# Patient Record
Sex: Female | Born: 1965
Health system: Southern US, Community
[De-identification: ages and names within clinical notes are randomized; demographics above are authoritative.]

## PROBLEM LIST (undated history)

## (undated) DIAGNOSIS — K219 Gastro-esophageal reflux disease without esophagitis: Secondary | ICD-10-CM

## (undated) DIAGNOSIS — J45909 Unspecified asthma, uncomplicated: Secondary | ICD-10-CM

## (undated) DIAGNOSIS — Z9109 Other allergy status, other than to drugs and biological substances: Secondary | ICD-10-CM

## (undated) HISTORY — DX: Unspecified asthma, uncomplicated: J45.909

## (undated) HISTORY — PX: LAPAROSCOPY: SHX197

## (undated) HISTORY — DX: Gastro-esophageal reflux disease without esophagitis: K21.9

## (undated) HISTORY — DX: Other allergy status, other than to drugs and biological substances: Z91.09

---

## 2004-10-02 ENCOUNTER — Ambulatory Visit: Payer: Self-pay | Admitting: Internal Medicine

## 2005-11-20 ENCOUNTER — Ambulatory Visit: Payer: Self-pay | Admitting: Unknown Physician Specialty

## 2006-03-05 ENCOUNTER — Ambulatory Visit: Payer: Self-pay | Admitting: Gastroenterology

## 2007-10-05 ENCOUNTER — Emergency Department: Payer: Self-pay | Admitting: Emergency Medicine

## 2008-03-18 ENCOUNTER — Ambulatory Visit: Payer: Self-pay | Admitting: Unknown Physician Specialty

## 2008-10-12 ENCOUNTER — Ambulatory Visit: Payer: Self-pay | Admitting: Internal Medicine

## 2009-01-11 ENCOUNTER — Ambulatory Visit: Payer: Self-pay | Admitting: Family

## 2010-04-17 ENCOUNTER — Ambulatory Visit: Payer: Self-pay | Admitting: Unknown Physician Specialty

## 2011-05-09 ENCOUNTER — Ambulatory Visit: Payer: Self-pay | Admitting: Unknown Physician Specialty

## 2014-04-06 ENCOUNTER — Ambulatory Visit: Payer: Self-pay | Admitting: Internal Medicine

## 2014-04-14 ENCOUNTER — Ambulatory Visit: Payer: Self-pay | Admitting: Specialist

## 2015-06-14 IMAGING — CR DG CHEST 2V
1 series · 2 of 2 positions shown · non-contrast
Comparison: None.

CLINICAL DATA: Right-sided pleuritic chest pain.

EXAM:
CHEST  2 VIEW

[Series 1: w chest pa · 0.14mm/px · 2 of 2 slices shown]
[im 1/2]
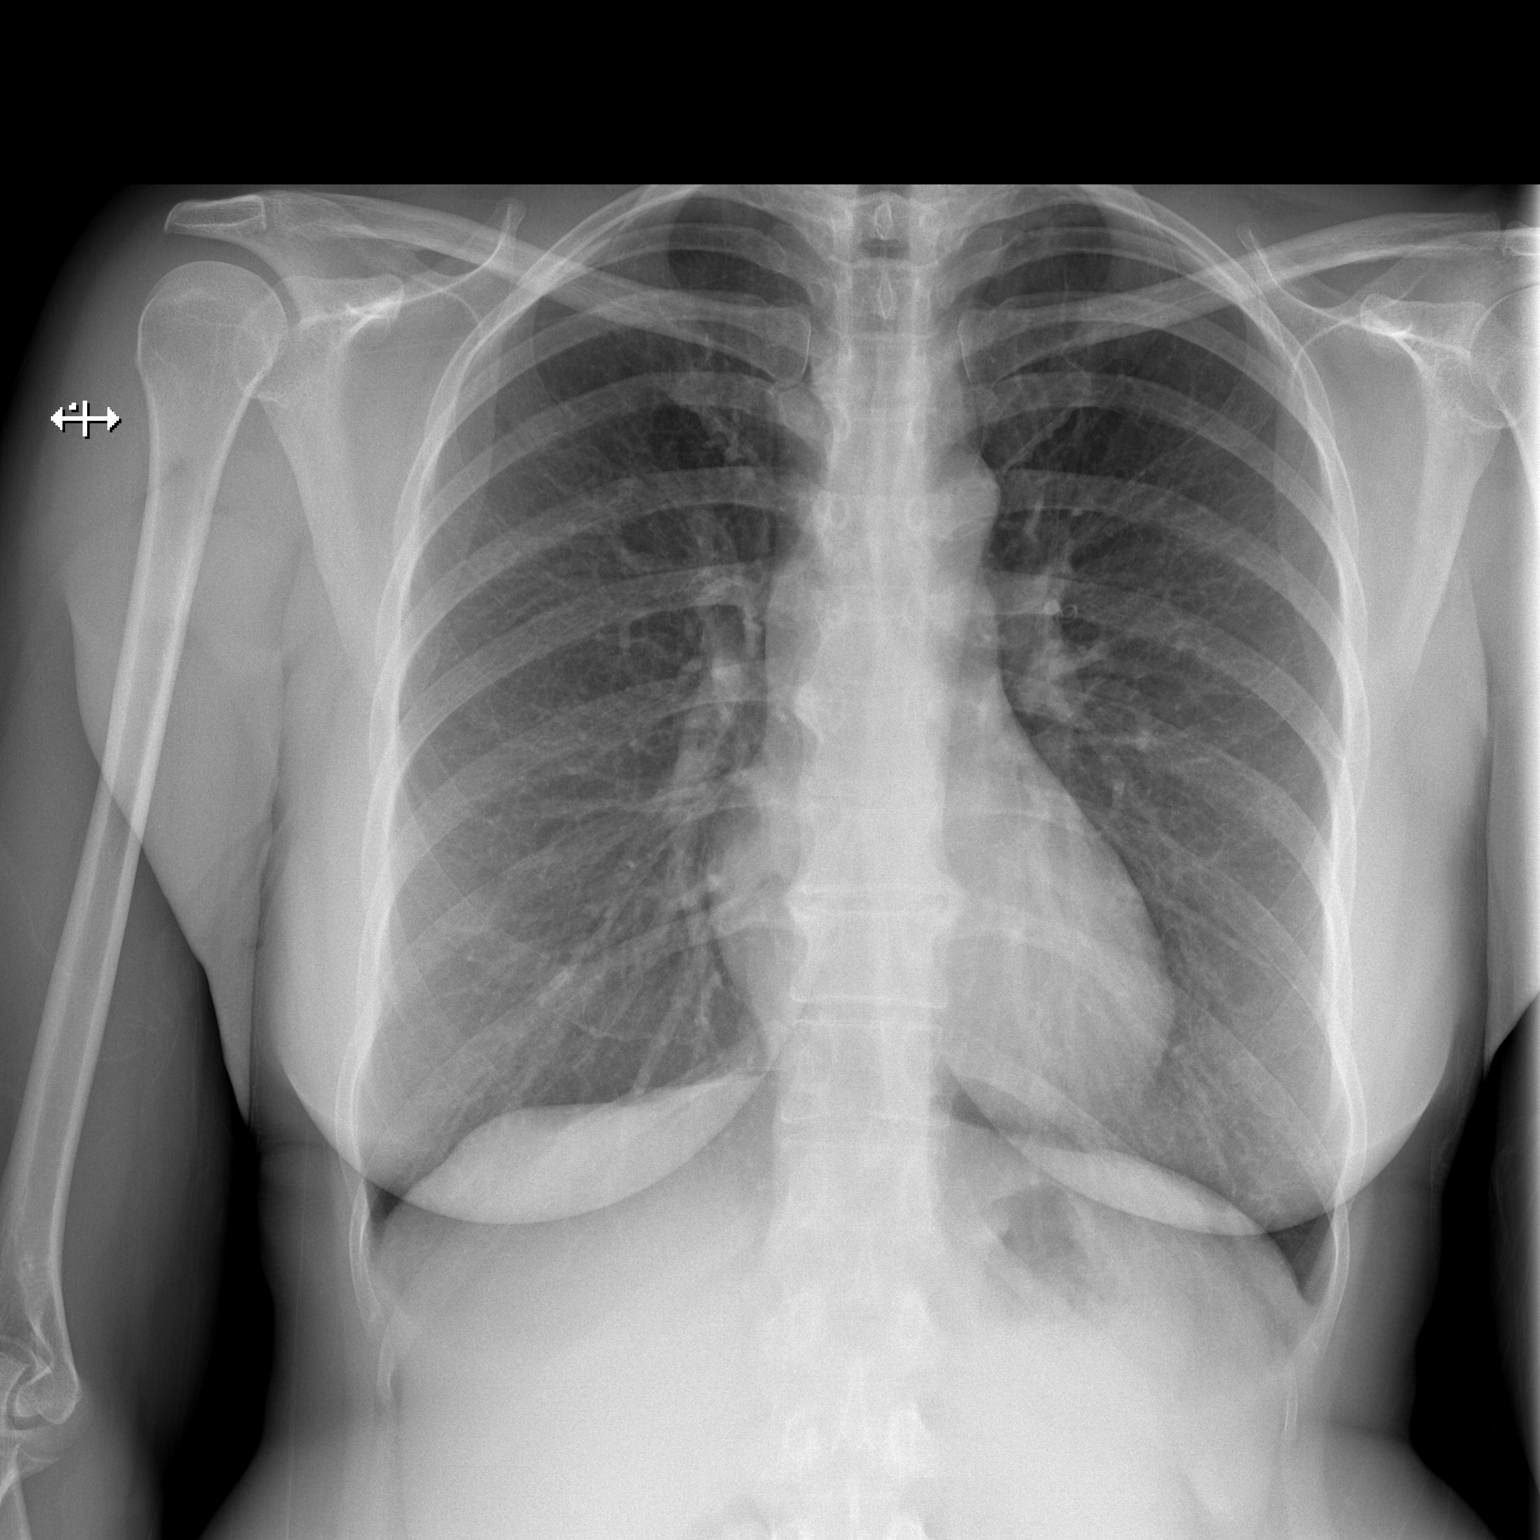
[im 2/2]
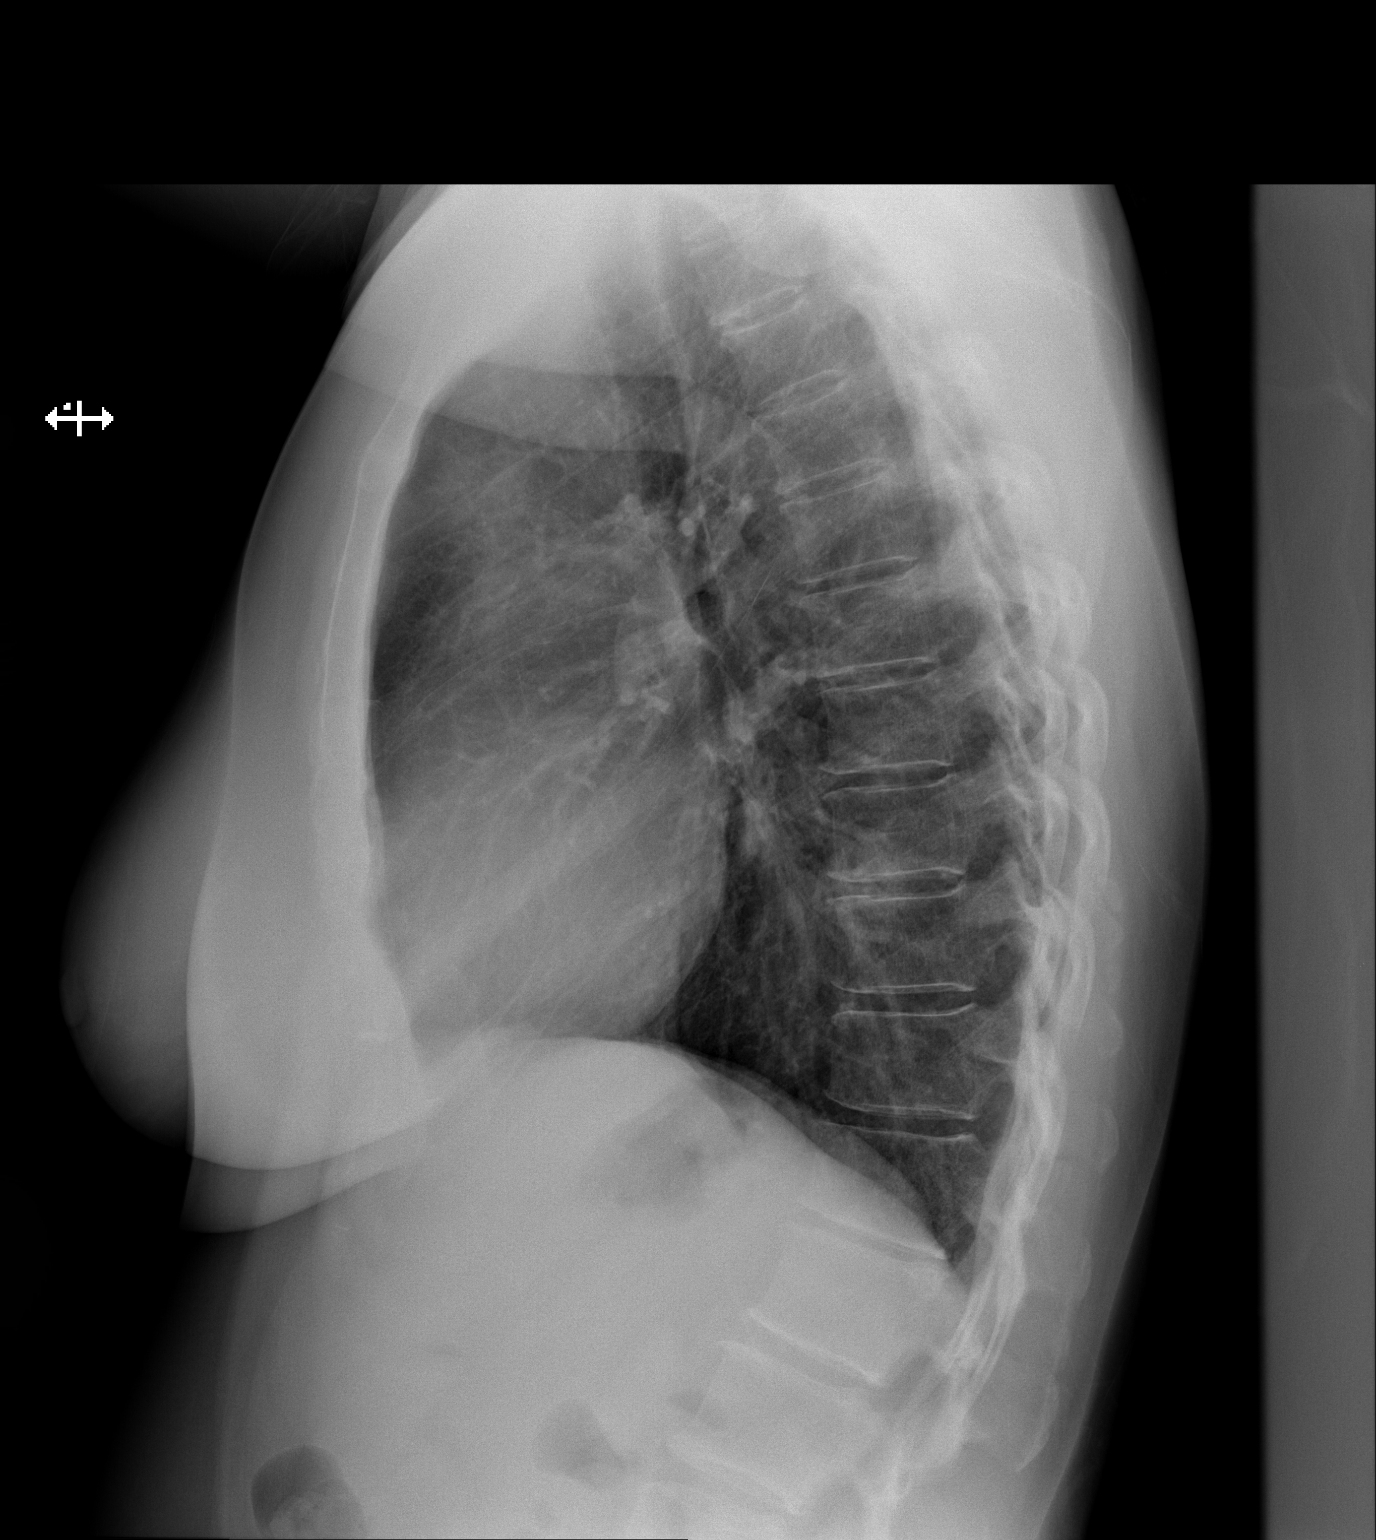

[2 of 2 positions shown; findings below may reference images not displayed]

FINDINGS: The heart size and mediastinal contours are within normal limits.
Both lungs are clear. No evidence of pneumothorax or pleural
effusion. The visualized skeletal structures are unremarkable.
IMPRESSION: No active cardiopulmonary disease.

## 2015-08-25 ENCOUNTER — Other Ambulatory Visit: Payer: Self-pay | Admitting: Internal Medicine

## 2015-08-25 DIAGNOSIS — K219 Gastro-esophageal reflux disease without esophagitis: Secondary | ICD-10-CM

## 2015-09-05 ENCOUNTER — Ambulatory Visit: Payer: Self-pay | Attending: Internal Medicine

## 2015-10-11 ENCOUNTER — Ambulatory Visit: Payer: Self-pay

## 2015-10-18 ENCOUNTER — Ambulatory Visit: Payer: Self-pay

## 2015-11-02 ENCOUNTER — Ambulatory Visit: Admission: RE | Admit: 2015-11-02 | Payer: Self-pay | Source: Ambulatory Visit

## 2016-05-17 ENCOUNTER — Ambulatory Visit
Admission: RE | Admit: 2016-05-17 | Discharge: 2016-05-17 | Disposition: A | Discharge status: Home / Self Care | Payer: BLUE CROSS/BLUE SHIELD | Source: Ambulatory Visit | Attending: Internal Medicine | Admitting: Internal Medicine

## 2016-05-17 ENCOUNTER — Other Ambulatory Visit: Payer: Self-pay | Admitting: Internal Medicine

## 2016-05-17 DIAGNOSIS — R059 Cough, unspecified: Secondary | ICD-10-CM

## 2016-05-17 DIAGNOSIS — R05 Cough: Secondary | ICD-10-CM | POA: Insufficient documentation

## 2016-08-21 ENCOUNTER — Telehealth: Payer: Self-pay | Admitting: Gastroenterology

## 2016-08-21 NOTE — Telephone Encounter (Signed)
colonoscopy

## 2016-08-22 ENCOUNTER — Other Ambulatory Visit: Payer: Self-pay

## 2016-08-22 NOTE — Telephone Encounter (Signed)
Gastroenterology Pre-Procedure Review  Request Date: 09/17/2016 Requesting Physician: Dr. Juel BurrowMasoud  PATIENT REVIEW QUESTIONS: The patient responded to the following health history questions as indicated:    1. Are you having any GI issues? yes (Diarrhea) 2. Do you have a personal history of Polyps? no 3. Do you have a family history of Colon Cancer or Polyps? no 4. Diabetes Mellitus? no 5. Joint replacements in the past 12 months?no 6. Major health problems in the past 3 months?no 7. Any artificial heart valves, MVP, or defibrillator?no    MEDICATIONS & ALLERGIES:    Patient reports the following regarding taking any anticoagulation/antiplatelet therapy:   Plavix, Coumadin, Eliquis, Xarelto, Lovenox, Pradaxa, Brilinta, or Effient? no Aspirin? no  Patient confirms/reports the following medications:  Current Outpatient Prescriptions  Medication Sig Dispense Refill  . ALBUTEROL SULFATE HFA IN Inhale into the lungs.    . budesonide-formoterol (SYMBICORT) 160-4.5 MCG/ACT inhaler Inhale 2 puffs into the lungs 2 (two) times daily.    Marland Kitchen. esomeprazole (NEXIUM) 20 MG capsule Take 20 mg by mouth daily at 12 noon.    . fluticasone (FLONASE) 50 MCG/ACT nasal spray instill 1 spray into each nostril twice a day  0  . levofloxacin (LEVAQUIN) 500 MG tablet Take 500 mg by mouth daily.    Marland Kitchen. omeprazole (PRILOSEC) 20 MG capsule Take 20 mg by mouth daily.     No current facility-administered medications for this visit.     Patient confirms/reports the following allergies:  No Known Allergies  No orders of the defined types were placed in this encounter.   AUTHORIZATION INFORMATION Primary Insurance: 1D#: Group #:  Secondary Insurance: 1D#: Group #:  SCHEDULE INFORMATION: Date: 09/17/2016 Time: Location: [facility]

## 2016-08-22 NOTE — Telephone Encounter (Signed)
Screening Colonoscopy Z12.11 [facility] 09/17/2016 Please pre cert  

## 2016-08-29 ENCOUNTER — Ambulatory Visit
Admission: RE | Admit: 2016-08-29 | Discharge: 2016-08-29 | Disposition: A | Discharge status: Home / Self Care | Payer: Self-pay | Source: Ambulatory Visit | Attending: Family Medicine | Admitting: Family Medicine

## 2016-08-29 ENCOUNTER — Other Ambulatory Visit (HOSPITAL_COMMUNITY): Payer: Self-pay | Admitting: Family Medicine

## 2016-08-29 DIAGNOSIS — R05 Cough: Secondary | ICD-10-CM

## 2016-08-29 DIAGNOSIS — R059 Cough, unspecified: Secondary | ICD-10-CM

## 2016-08-29 DIAGNOSIS — I700 Atherosclerosis of aorta: Secondary | ICD-10-CM | POA: Insufficient documentation

## 2016-09-12 ENCOUNTER — Other Ambulatory Visit: Payer: Self-pay

## 2016-09-17 ENCOUNTER — Other Ambulatory Visit: Payer: Self-pay

## 2016-09-17 DIAGNOSIS — Z1211 Encounter for screening for malignant neoplasm of colon: Secondary | ICD-10-CM

## 2016-09-17 MED ORDER — PEG 3350-KCL-NABCB-NACL-NASULF 236 G PO SOLR: 4000.0000 mL | Freq: Once | ORAL | 0 refills | Status: AC

## 2016-09-17 MED ORDER — NA SULFATE-K SULFATE-MG SULF 17.5-3.13-1.6 GM/177ML PO SOLN: 1.0000 | Freq: Once | ORAL | 0 refills | Status: AC

## 2016-10-02 ENCOUNTER — Telehealth: Payer: Self-pay

## 2016-10-02 NOTE — Telephone Encounter (Signed)
Patient initially called to reschedule her colonoscopy. I gave her November 2 and she was fine with that date. I let her know again that the procedure will be done by doctor Wohl on that day. She thought she was having the procedure performed by a female Bangladeshindian doctor. I let her know that we do not have a GI lady physician in our clinic. She wants her procedure to be canceled. I told her that I will cancel her procedure and advised her to contact her PCP to have a referral sent to the right provider.

## 2016-10-08 ENCOUNTER — Encounter: Admission: RE | Payer: Self-pay | Source: Ambulatory Visit

## 2016-10-08 ENCOUNTER — Ambulatory Visit
Admission: RE | Admit: 2016-10-08 | Payer: BLUE CROSS/BLUE SHIELD | Source: Ambulatory Visit | Admitting: Gastroenterology

## 2016-10-08 SURGERY — COLONOSCOPY WITH PROPOFOL
Anesthesia: General

## 2017-11-06 IMAGING — CR DG CHEST 1V
1 series · 1 of 1 positions shown · non-contrast
Comparison: PA and lateral chest x-ray May 17, 2016

CLINICAL DATA: Cough, history of asthma, recurrent bronchitis.
Nonsmoker.

EXAM:
CHEST 1 VIEW

[w chest pa]
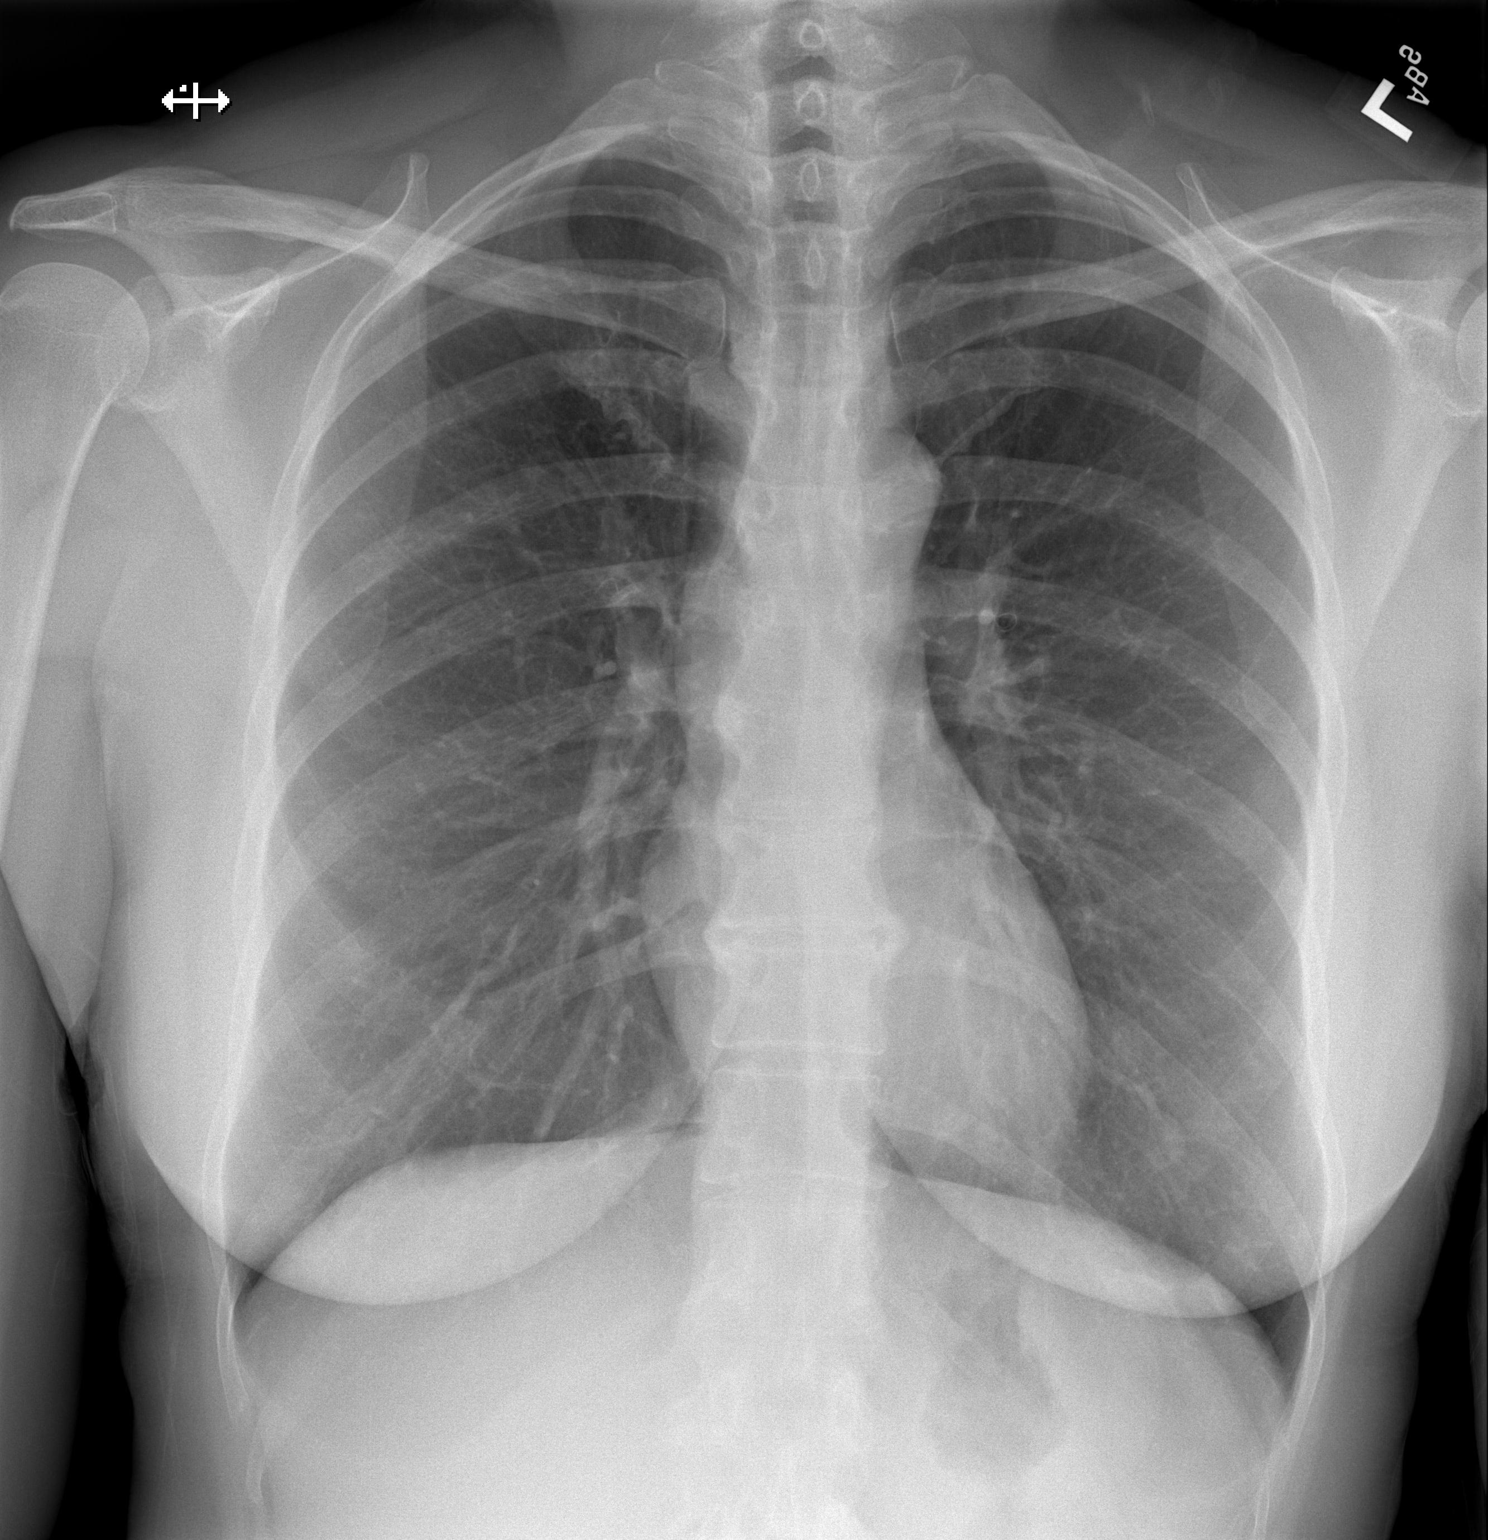

[1 of 1 positions shown; findings below may reference images not displayed]

FINDINGS: The lungs are mildly hyperinflated but clear. The heart and
pulmonary vascularity are normal. The mediastinum is normal in
width. There is calcification in the wall of the aortic arch. There
is mild degenerative disc disease of the thoracic spine.
IMPRESSION: Hyperinflation consistent with reactive airway disease. There is no
pneumonia nor other acute cardiopulmonary abnormality.

Aortic atherosclerosis.

## 2018-04-17 ENCOUNTER — Ambulatory Visit: Payer: BLUE CROSS/BLUE SHIELD | Admitting: Internal Medicine

## 2018-04-17 ENCOUNTER — Ambulatory Visit: Payer: Self-pay | Admitting: Internal Medicine

## 2018-04-17 ENCOUNTER — Encounter: Payer: Self-pay | Admitting: Internal Medicine

## 2018-04-17 VITALS — BP 118/72 | HR 85 | Resp 16 | Ht 65.0 in | Wt 166.4 lb

## 2018-04-17 DIAGNOSIS — J4520 Mild intermittent asthma, uncomplicated: Secondary | ICD-10-CM

## 2018-04-17 DIAGNOSIS — R0602 Shortness of breath: Secondary | ICD-10-CM | POA: Diagnosis not present

## 2018-04-17 DIAGNOSIS — K219 Gastro-esophageal reflux disease without esophagitis: Secondary | ICD-10-CM

## 2018-04-17 DIAGNOSIS — Z9109 Other allergy status, other than to drugs and biological substances: Secondary | ICD-10-CM | POA: Insufficient documentation

## 2018-04-17 DIAGNOSIS — J301 Allergic rhinitis due to pollen: Secondary | ICD-10-CM | POA: Diagnosis not present

## 2018-04-17 DIAGNOSIS — J45909 Unspecified asthma, uncomplicated: Secondary | ICD-10-CM | POA: Insufficient documentation

## 2018-04-17 NOTE — Patient Instructions (Signed)

## 2018-04-17 NOTE — Progress Notes (Signed)
[facility] [address] [place], [place] [address]  Pulmonary Sleep Medicine   Office Visit Note  Patient Name: Yvonne Jones DOB: February 06, 1966 MRN [id number]  Date of Service: 04/17/2018  Complaints/HPI:  Patient is doing fairly well at this time.  She has little bit of cough she was [place] and feels that she contracted something overseas but now is actually resolution.  She has not been taking allergy shots since last October.  At this time she wants to wait for another month before she tries to restart her allergy shots.  She is doing well eyes spirometry actually looks good  ROS  General: (-) fever, (-) chills, (-) night sweats, (-) weakness Skin: (-) rashes, (-) itching,. Eyes: (-) visual changes, (-) redness, (-) itching. Nose and Sinuses: (-) nasal stuffiness or itchiness, (-) postnasal drip, (-) nosebleeds, (-) sinus trouble. Mouth and Throat: (-) sore throat, (-) hoarseness. Neck: (-) swollen glands, (-) enlarged thyroid, (-) neck pain. Respiratory: + cough, (-) bloody sputum, + shortness of breath, + wheezing. Cardiovascular: - ankle swelling, (-) chest pain. Lymphatic: (-) lymph node enlargement. Neurologic: (-) numbness, (-) tingling. Psychiatric: (-) anxiety, (-) depression   Current Medication: Outpatient Encounter Medications as of 04/17/2018  Medication Sig Note  . ALBUTEROL SULFATE HFA IN Inhale into the lungs.   . budesonide-formoterol (SYMBICORT) 160-4.5 MCG/ACT inhaler Inhale 2 puffs into the lungs 2 (two) times daily.   . montelukast (SINGULAIR) 10 MG tablet Take 10 mg by mouth at bedtime.   . fluconazole (DIFLUCAN) 150 MG tablet  09/17/2016: Received from: External Pharmacy  . fluticasone (FLONASE) 50 MCG/ACT nasal spray instill 1 spray into each nostril twice a day 08/22/2016: Received from: External Pharmacy  . [DISCONTINUED] esomeprazole (NEXIUM) 20 MG capsule Take 20 mg by mouth daily at 12 noon.   . [DISCONTINUED] levofloxacin (LEVAQUIN)  500 MG tablet Take 500 mg by mouth daily.   . [DISCONTINUED] omeprazole (PRILOSEC) 20 MG capsule Take 20 mg by mouth daily.    No facility-administered encounter medications on file as of 04/17/2018.     Surgical History:   Medical History: Past Medical History:  Diagnosis Date  . Asthma   . Environmental allergies   . GERD (gastroesophageal reflux disease)     Family History: Family History  Problem Relation Age of Onset  . Heart disease Mother   . Diabetes Father     Social History: Social History   Socioeconomic History  . Marital status: Married    Spouse name: Not on file  . Number of children: Not on file  . Years of education: Not on file  . Highest education level: Not on file  Occupational History  . Not on file  Social Needs  . Financial resource strain: Not on file  . Food insecurity:    Worry: Not on file    Inability: Not on file  . Transportation needs:    Medical: Not on file    Non-medical: Not on file  Tobacco Use  . Smoking status: Never Smoker  . Smokeless tobacco: Never Used  Substance and Sexual Activity  . Alcohol use: Never    Frequency: Never  . Drug use: Never  . Sexual activity: Not on file  Lifestyle  . Physical activity:    Days per week: Not on file    Minutes per session: Not on file  . Stress: Not on file  Relationships  . Social connections:    Talks on phone: Not on file  Gets together: Not on file    Attends religious service: Not on file    Active member of club or organization: Not on file    Attends meetings of clubs or organizations: Not on file    Relationship status: Not on file  . Intimate partner violence:    Fear of current or ex partner: Not on file    Emotionally abused: Not on file    Physically abused: Not on file    Forced sexual activity: Not on file  Other Topics Concern  . Not on file  Social History Narrative  . Not on file    Vital Signs: Blood pressure 118/72, pulse 85, resp. rate 16,  height 5\' 5"  (1.651 m), weight 166 lb 6.4 oz (75.5 kg), SpO2 98 %.  Examination: General Appearance: The patient is well-developed, well-nourished, and in no distress. Skin: Gross inspection of skin unremarkable. Head: normocephalic, no gross deformities. Eyes: no gross deformities noted. ENT: ears appear grossly normal no exudates. Neck: Supple. No thyromegaly. No LAD. Respiratory:  clear. Cardiovascular: Normal S1 and S2 without murmur or rub. Extremities: No cyanosis. pulses are equal. Neurologic: Alert and oriented. No involuntary movements.  LABS: No results found for this or any previous visit (from the past 2160 hour(s)).  Radiology: Dg Chest 1 View  Result Date: 08/29/2016 CLINICAL DATA:  Cough, history of asthma, recurrent bronchitis. Nonsmoker. EXAM: CHEST 1 VIEW COMPARISON:  PA and lateral chest x-ray of May 17, 2016 FINDINGS: The lungs are mildly hyperinflated but clear. The heart and pulmonary vascularity are normal. The mediastinum is normal in width. There is calcification in the wall of the aortic arch. There is mild degenerative disc disease of the thoracic spine. IMPRESSION: Hyperinflation consistent with reactive airway disease. There is no pneumonia nor other acute cardiopulmonary abnormality. Aortic atherosclerosis. Electronically Signed   By: David  SwazilandJordan M.D.   On: 08/29/2016 13:37    No results found.  No results found.    Assessment and Plan: Patient Active Problem List   Diagnosis Date Noted  . Environmental allergies 04/17/2018  . Asthma 04/17/2018  . GERD (gastroesophageal reflux disease) 04/17/2018    1. Asthma continue with present therapy continue supportive care 2. Allergic Rhinitis she is taking antihistamine Singulair continue 3. GERD no reflux noted at this 4.  shortness of breath will continue present  General Counseling: I have discussed the findings of the evaluation and examination with Yvonne Jones.  I have also discussed any further  diagnostic evaluation thatmay be needed or ordered today. Yvonne Jones verbalizes understanding of the findings of todays visit. We also reviewed her medications today and discussed drug interactions and side effects including but not limited excessive drowsiness and altered mental states. We also discussed that there is always a risk not just to her but also people around her. she has been encouraged to call the office with any questions or concerns that should arise related to todays visit.    Time spent: 15min  I have personally obtained a history, examined the patient, evaluated laboratory and imaging results, formulated the assessment and plan and placed orders.    Yevonne PaxSaadat A Khan, MD [facility] Pulmonary and Critical Care Sleep medicine

## 2018-04-28 ENCOUNTER — Ambulatory Visit: Payer: Self-pay | Admitting: Internal Medicine

## 2018-07-17 ENCOUNTER — Other Ambulatory Visit: Payer: Self-pay | Admitting: Internal Medicine

## 2018-07-17 DIAGNOSIS — Z1231 Encounter for screening mammogram for malignant neoplasm of breast: Secondary | ICD-10-CM

## 2018-07-25 ENCOUNTER — Encounter: Payer: Self-pay | Admitting: Certified Nurse Midwife

## 2018-07-25 ENCOUNTER — Ambulatory Visit: Payer: BLUE CROSS/BLUE SHIELD | Admitting: Certified Nurse Midwife

## 2018-07-25 ENCOUNTER — Other Ambulatory Visit (HOSPITAL_COMMUNITY)
Admission: RE | Admit: 2018-07-25 | Discharge: 2018-07-25 | Disposition: A | Discharge status: Home / Self Care | Payer: BLUE CROSS/BLUE SHIELD | Source: Ambulatory Visit | Attending: Certified Nurse Midwife | Admitting: Certified Nurse Midwife

## 2018-07-25 VITALS — BP 118/70 | HR 75 | Ht 65.0 in | Wt 162.1 lb

## 2018-07-25 DIAGNOSIS — N898 Other specified noninflammatory disorders of vagina: Secondary | ICD-10-CM

## 2018-07-25 DIAGNOSIS — Z124 Encounter for screening for malignant neoplasm of cervix: Secondary | ICD-10-CM | POA: Diagnosis not present

## 2018-07-25 MED ORDER — CLOBETASOL PROPIONATE 0.05 % EX OINT
1.0000 | TOPICAL_OINTMENT | Freq: Two times a day (BID) | CUTANEOUS | 0 refills | Status: DC
Start: 2018-07-25 — End: 2020-09-29

## 2018-07-25 NOTE — Progress Notes (Signed)
GYNECOLOGY ANNUAL PREVENTATIVE CARE ENCOUNTER NOTE  Subjective:   Yvonne Jones is a 52 y.o. [id number] female here for a routine annual gynecologic exam.  Current complaints: vaginal itching.   Has Lichen sclerous.  Denies abnormal vaginal bleeding, discharge, pelvic pain, problems with intercourse or other gynecologic concerns.    Gynecologic History No LMP recorded. Patient is postmenopausal. Contraception: none and menoupause Last Pap: 6-7 yrs ago. Results were: normal per pt Last mammogram: 05/09/2011. Results were: normal ( has family history of breast cancer)  Obstetric History OB History  Gravida Para Term Preterm AB Living  2 2 2     2   SAB TAB Ectopic Multiple Live Births          2    # Outcome Date GA Lbr Len/2nd Weight Sex Delivery Anes PTL Lv  2 Term 1986    F Vag-Spont   LIV  1 Term 1984    M Vag-Spont   LIV    Past Medical History:  Diagnosis Date  . Asthma   . Environmental allergies   . GERD (gastroesophageal reflux disease)     Past Surgical History:  Procedure Laterality Date  . LAPAROSCOPY      Current Outpatient Medications on File Prior to Visit  Medication Sig Dispense Refill  . ALBUTEROL SULFATE HFA IN Inhale into the lungs.    . budesonide-formoterol (SYMBICORT) 160-4.5 MCG/ACT inhaler Inhale 2 puffs into the lungs 2 (two) times daily.    . fluconazole (DIFLUCAN) 150 MG tablet   0  . montelukast (SINGULAIR) 10 MG tablet Take 10 mg by mouth at bedtime.    . fluticasone (FLONASE) 50 MCG/ACT nasal spray instill 1 spray into each nostril twice a day  0   No current facility-administered medications on file prior to visit.     No Known Allergies  Social History:  reports that she has never smoked. She has never used smokeless tobacco. She reports that she does not drink alcohol or use drugs.  Currently exercises 3-4 x wk for 1-1.5 hrs.  Decreased caffeine in take recently to decrease menopausal symptoms Family History  Problem Relation Age of  Onset  . Heart disease Mother   . Diabetes Father     The following portions of the patient's history were reviewed and updated as appropriate: allergies, current medications, past family history, past medical history, past social history, past surgical history and problem list.  Review of Systems Pertinent items noted in HPI and remainder of comprehensive ROS otherwise negative.   Objective:  BP 118/70   Pulse 75   Ht 5\' 5"  (1.651 m)   Wt 162 lb 2 oz (73.5 kg)   BMI 26.98 kg/m  CONSTITUTIONAL: Well-developed, well-nourished female in no acute distress.  HENT:  Normocephalic, atraumatic, External right and left ear normal. Oropharynx is clear and moist EYES: Conjunctivae and EOM are normal. Pupils are equal, round, and reactive to light. No scleral icterus.  NECK: Normal range of motion, supple, no masses.  Normal thyroid.  SKIN: Skin is warm and dry. No rash noted. Not diaphoretic. No erythema. No pallor. MUSCULOSKELETAL: Normal range of motion. No tenderness.  No cyanosis, clubbing, or edema.  2+ distal pulses. NEUROLOGIC: Alert and oriented to person, place, and time. Normal reflexes, muscle tone coordination. No cranial nerve deficit noted. PSYCHIATRIC: Normal mood and affect. Normal behavior. Normal judgment and thought content. CARDIOVASCULAR: Normal heart rate noted, regular rhythm RESPIRATORY: Clear to auscultation bilaterally. Effort and breath sounds normal, no problems  with respiration noted. BREASTS: Symmetric in size. No masses, skin changes, nipple drainage, or lymphadenopathy. ABDOMEN: Soft, normal bowel sounds, no distention noted.  No tenderness, rebound or guarding.  PELVIC: Normal appearing external genitalia; normal appearing vaginal mucosa and cervix.  No abnormal discharge noted.  Pap smear obtained.  Contract bleeding noted. Vaginal atrophy noted . Normal uterine size, no other palpable masses, no uterine or adnexal tenderness.    Assessment and Plan:  Annual  Well women exam Will follow up results of pap smear and manage accordingly. Vaginal swab BV Mammogram scheduled Pt has some symptoms of menopause. She declines hormonal treatment at this time. Information on self help measure given . Encourage use of lubrication with intercourse.  Pt state she has had labs with other provider. Declines at this time.  Information on genetic testing for Breast cancer gene given.  Routine preventative health maintenance measures emphasized. Please refer to After Visit Summary for other counseling recommendations.    Doreene Burke, CNM

## 2018-07-25 NOTE — Patient Instructions (Signed)
Preventive Care 40-64 Years, Female Preventive care refers to lifestyle choices and visits with your health care provider that can promote health and wellness. What does preventive care include?  A yearly physical exam. This is also called an annual well check.  Dental exams once or twice a year.  Routine eye exams. Ask your health care provider how often you should have your eyes checked.  Personal lifestyle choices, including: ? Daily care of your teeth and gums. ? Regular physical activity. ? Eating a healthy diet. ? Avoiding tobacco and drug use. ? Limiting alcohol use. ? Practicing safe sex. ? Taking low-dose aspirin daily starting at age 58. ? Taking vitamin and mineral supplements as recommended by your health care provider. What happens during an annual well check? The services and screenings done by your health care provider during your annual well check will depend on your age, overall health, lifestyle risk factors, and family history of disease. Counseling Your health care provider may ask you questions about your:  Alcohol use.  Tobacco use.  Drug use.  Emotional well-being.  Home and relationship well-being.  Sexual activity.  Eating habits.  Work and work Statistician.  Method of birth control.  Menstrual cycle.  Pregnancy history.  Screening You may have the following tests or measurements:  Height, weight, and BMI.  Blood pressure.  Lipid and cholesterol levels. These may be checked every 5 years, or more frequently if you are over 81 years old.  Skin check.  Lung cancer screening. You may have this screening every year starting at age 78 if you have a 30-pack-year history of smoking and currently smoke or have quit within the past 15 years.  Fecal occult blood test (FOBT) of the stool. You may have this test every year starting at age 65.  Flexible sigmoidoscopy or colonoscopy. You may have a sigmoidoscopy every 5 years or a colonoscopy  every 10 years starting at age 30.  Hepatitis C blood test.  Hepatitis B blood test.  Sexually transmitted disease (STD) testing.  Diabetes screening. This is done by checking your blood sugar (glucose) after you have not eaten for a while (fasting). You may have this done every 1-3 years.  Mammogram. This may be done every 1-2 years. Talk to your health care provider about when you should start having regular mammograms. This may depend on whether you have a family history of breast cancer.  BRCA-related cancer screening. This may be done if you have a family history of breast, ovarian, tubal, or peritoneal cancers.  Pelvic exam and Pap test. This may be done every 3 years starting at age 80. Starting at age 36, this may be done every 5 years if you have a Pap test in combination with an HPV test.  Bone density scan. This is done to screen for osteoporosis. You may have this scan if you are at high risk for osteoporosis.  Discuss your test results, treatment options, and if necessary, the need for more tests with your health care provider. Vaccines Your health care provider may recommend certain vaccines, such as:  Influenza vaccine. This is recommended every year.  Tetanus, diphtheria, and acellular pertussis (Tdap, Td) vaccine. You may need a Td booster every 10 years.  Varicella vaccine. You may need this if you have not been vaccinated.  Zoster vaccine. You may need this after age 5.  Measles, mumps, and rubella (MMR) vaccine. You may need at least one dose of MMR if you were born in  1958 or later. You may also need a second dose.  Pneumococcal 13-valent conjugate (PCV13) vaccine. You may need this if you have certain conditions and were not previously vaccinated.  Pneumococcal polysaccharide (PPSV23) vaccine. You may need one or two doses if you smoke cigarettes or if you have certain conditions.  Meningococcal vaccine. You may need this if you have certain  conditions.  Hepatitis A vaccine. You may need this if you have certain conditions or if you travel or work in places where you may be exposed to hepatitis A.  Hepatitis B vaccine. You may need this if you have certain conditions or if you travel or work in places where you may be exposed to hepatitis B.  Haemophilus influenzae type b (Hib) vaccine. You may need this if you have certain conditions.  Talk to your health care provider about which screenings and vaccines you need and how often you need them. This information is not intended to replace advice given to you by your health care provider. Make sure you discuss any questions you have with your health care provider. Document Released: 01/06/2016 Document Revised: 08/29/2016 Document Reviewed: 10/11/2015 Elsevier Interactive Patient Education  2018 Elsevier Inc.  

## 2018-07-25 NOTE — Progress Notes (Signed)
[facility]. Denies vag discharge. Also has a lump left axillary. Would like a pap smear- LPS about 7 years ago and was normal.

## 2018-07-28 LAB — CYTOLOGY - PAP
DIAGNOSIS: NEGATIVE
HPV: NOT DETECTED

## 2018-07-28 LAB — CERVICOVAGINAL ANCILLARY ONLY
BACTERIAL VAGINITIS: NEGATIVE
CANDIDA VAGINITIS: NEGATIVE

## 2018-07-29 ENCOUNTER — Telehealth: Payer: Self-pay

## 2018-07-29 ENCOUNTER — Telehealth: Payer: Self-pay | Admitting: Certified Nurse Midwife

## 2018-07-29 NOTE — Telephone Encounter (Signed)
Left message for pt to please return my call regarding negative pap and cultures per AT.

## 2018-07-29 NOTE — Telephone Encounter (Signed)
Another message left for pt to please return my call re: negative test results per ML.

## 2018-07-29 NOTE — Telephone Encounter (Signed)
The patient called and stated that she would lie,k to speak with her nurse in regards to her test results. No other information was disclosed. Please advise.

## 2018-07-30 ENCOUNTER — Telehealth: Payer: Self-pay

## 2018-07-30 NOTE — Telephone Encounter (Signed)
Returned pts call after leaving 2 messages on her voicemail- negative test results per AT.

## 2018-08-11 ENCOUNTER — Ambulatory Visit
Admission: RE | Admit: 2018-08-11 | Discharge: 2018-08-11 | Disposition: A | Discharge status: Home / Self Care | Payer: BLUE CROSS/BLUE SHIELD | Source: Ambulatory Visit | Attending: Internal Medicine | Admitting: Internal Medicine

## 2018-08-11 DIAGNOSIS — Z1231 Encounter for screening mammogram for malignant neoplasm of breast: Secondary | ICD-10-CM | POA: Insufficient documentation

## 2018-09-23 ENCOUNTER — Encounter: Payer: Self-pay | Admitting: Gastroenterology

## 2018-09-23 ENCOUNTER — Ambulatory Visit: Payer: BLUE CROSS/BLUE SHIELD | Admitting: Gastroenterology

## 2018-09-23 ENCOUNTER — Encounter (INDEPENDENT_AMBULATORY_CARE_PROVIDER_SITE_OTHER): Payer: Self-pay

## 2018-09-23 ENCOUNTER — Other Ambulatory Visit: Payer: Self-pay

## 2018-09-23 VITALS — BP 112/76 | HR 71 | Resp 17 | Ht 65.0 in | Wt 160.8 lb

## 2018-09-23 DIAGNOSIS — K529 Noninfective gastroenteritis and colitis, unspecified: Secondary | ICD-10-CM | POA: Diagnosis not present

## 2018-09-23 DIAGNOSIS — A048 Other specified bacterial intestinal infections: Secondary | ICD-10-CM | POA: Diagnosis not present

## 2018-09-23 DIAGNOSIS — Z1211 Encounter for screening for malignant neoplasm of colon: Secondary | ICD-10-CM

## 2018-09-23 NOTE — Progress Notes (Signed)
Arlyss Repress, MD [address]  [address]  [place], [place] [address]  Main: [phone]  Fax: [phone]    Gastroenterology Consultation  Referring Provider:     Corky Downs, MD Primary Care Physician:  Corky Downs, MD Primary Gastroenterologist:  Dr. Arlyss Repress Reason for Consultation:     Colon cancer screening        HPI:   Yvonne Jones is a 52 y.o. female referred by Dr. Corky Downs, MD  for consultation & management of chronic heartburn and postprandial diarrhea.  Patient reports that she has been experiencing burning sensation in her chest and regurgitation for which she takes omeprazole OTC as needed.  She reports that she had an upper endoscopy performed several years ago and she was also treated for H. pylori infection.  She reports chronic postprandial diarrhea, nonbloody associated with cramps and bloating. This has been going on for several years.  She denies nausea, vomiting, weight loss She has trouble falling asleep and sleeps very little She reports having regular stress in her life She denies any GI surgeries She moved from [place] about 20 years ago  NSAIDs: None  Antiplts/Anticoagulants/Anti thrombotics: None  GI Procedures: EGD at [facility] approximately 2007 Report not available She denies family history of GI malignancy  Past Medical History:  Diagnosis Date  . Asthma   . Environmental allergies   . GERD (gastroesophageal reflux disease)     Past Surgical History:  Procedure Laterality Date  . LAPAROSCOPY      Current Outpatient Medications:  .  ALBUTEROL SULFATE HFA IN, Inhale into the lungs., Disp: , Rfl:  .  budesonide-formoterol (SYMBICORT) 160-4.5 MCG/ACT inhaler, Inhale 2 puffs into the lungs 2 (two) times daily., Disp: , Rfl:  .  clobetasol ointment (TEMOVATE) 0.05 %, Apply 1 application topically 2 (two) times daily., Disp: 30 g, Rfl: 0 .  fluconazole (DIFLUCAN) 150 MG tablet, , Disp: , Rfl: 0 .   fluticasone (FLONASE) 50 MCG/ACT nasal spray, instill 1 spray into each nostril twice a day, Disp: , Rfl: 0 .  montelukast (SINGULAIR) 10 MG tablet, Take 10 mg by mouth at bedtime., Disp: , Rfl:   Family History  Problem Relation Age of Onset  . Heart disease Mother   . Diabetes Father      Social History   Tobacco Use  . Smoking status: Never Smoker  . Smokeless tobacco: Never Used  Substance Use Topics  . Alcohol use: Never    Frequency: Never  . Drug use: Never    Allergies as of 09/23/2018  . (No Known Allergies)    Review of Systems:    All systems reviewed and negative except where noted in HPI.   Physical Exam:  BP 112/76 (BP Location: Left Arm, Patient Position: Sitting, Cuff Size: Normal)   Pulse 71   Resp 17   Ht 5\' 5"  (1.651 m)   Wt 160 lb 12.8 oz (72.9 kg)   BMI 26.76 kg/m  No LMP recorded. Patient is postmenopausal.  General:   Alert,  Well-developed, well-nourished, pleasant and cooperative in NAD Head:  Normocephalic and atraumatic. Eyes:  Sclera clear, no icterus.   Conjunctiva pink. Ears:  Normal auditory acuity. Nose:  No deformity, discharge, or lesions. Mouth:  No deformity or lesions,oropharynx pink & moist. Neck:  Supple; no masses or thyromegaly. Lungs:  Respirations even and unlabored.  Clear throughout to auscultation.   No wheezes, crackles, or rhonchi. No acute distress. Heart:  Regular rate and rhythm; no murmurs, clicks, rubs, or gallops. Abdomen:  Normal bowel sounds. Soft, non-tender and mildly distended without masses, hepatosplenomegaly or hernias noted.  No guarding or rebound tenderness.   Rectal: Not performed Msk:  Symmetrical without gross deformities. Good, equal movement & strength bilaterally. Pulses:  Normal pulses noted. Extremities:  No clubbing or edema.  No cyanosis. Neurologic:  Alert and oriented x3;  grossly normal neurologically. Skin:  Intact without significant lesions or rashes. No jaundice. Lymph Nodes:  No  significant cervical adenopathy. Psych:  Alert and cooperative. Normal mood and affect.  Imaging Studies: None  Assessment and Plan:   Yvonne Jones is a 52 y.o. female with history of H. pylori infection status post treatment, chronic heartburn and regurgitation, postprandial diarrhea without any constitutional symptoms.  She is also overdue for colon cancer screening  Recommend H. pylori breath test Check TTG IgA, total IgA Recommend colonoscopy with colon biopsies Discussed with her that stress can contribute to her GI symptoms Will start low-dose amitriptyline after the above work-up if negative  Follow up in 2 months   Arlyss Repress, MD

## 2018-10-01 ENCOUNTER — Encounter: Payer: Self-pay | Admitting: Emergency Medicine

## 2018-10-02 ENCOUNTER — Encounter: Payer: Self-pay | Admitting: Anesthesiology

## 2018-10-02 ENCOUNTER — Encounter
Admission: RE | Disposition: A | Discharge status: Home / Self Care | Payer: Self-pay | Source: Ambulatory Visit | Attending: Gastroenterology

## 2018-10-02 ENCOUNTER — Ambulatory Visit
Admission: RE | Admit: 2018-10-02 | Discharge: 2018-10-02 | Disposition: A | Discharge status: Home / Self Care | Payer: BLUE CROSS/BLUE SHIELD | Source: Ambulatory Visit | Attending: Gastroenterology | Admitting: Gastroenterology

## 2018-10-02 ENCOUNTER — Ambulatory Visit: Payer: BLUE CROSS/BLUE SHIELD | Admitting: Certified Registered"

## 2018-10-02 DIAGNOSIS — K529 Noninfective gastroenteritis and colitis, unspecified: Secondary | ICD-10-CM | POA: Insufficient documentation

## 2018-10-02 DIAGNOSIS — K6389 Other specified diseases of intestine: Secondary | ICD-10-CM | POA: Diagnosis not present

## 2018-10-02 DIAGNOSIS — Z1211 Encounter for screening for malignant neoplasm of colon: Secondary | ICD-10-CM | POA: Diagnosis present

## 2018-10-02 DIAGNOSIS — Z79899 Other long term (current) drug therapy: Secondary | ICD-10-CM | POA: Diagnosis not present

## 2018-10-02 DIAGNOSIS — J45909 Unspecified asthma, uncomplicated: Secondary | ICD-10-CM | POA: Diagnosis not present

## 2018-10-02 DIAGNOSIS — Z7951 Long term (current) use of inhaled steroids: Secondary | ICD-10-CM | POA: Insufficient documentation

## 2018-10-02 HISTORY — PX: COLONOSCOPY WITH PROPOFOL: SHX5780

## 2018-10-02 SURGERY — COLONOSCOPY WITH PROPOFOL
Anesthesia: General

## 2018-10-02 MED ORDER — PROPOFOL 10 MG/ML IV BOLUS
INTRAVENOUS | Status: DC | PRN
  Administered 2018-10-02: 80 mg via INTRAVENOUS

## 2018-10-02 MED ORDER — PROPOFOL 500 MG/50ML IV EMUL
INTRAVENOUS | Status: DC | PRN
  Administered 2018-10-02: 150 ug/kg/min via INTRAVENOUS

## 2018-10-02 MED ORDER — LIDOCAINE HCL (CARDIAC) PF 100 MG/5ML IV SOSY
PREFILLED_SYRINGE | INTRAVENOUS | Status: DC | PRN
  Administered 2018-10-02: 80 mg via INTRAVENOUS

## 2018-10-02 MED ORDER — PROPOFOL 10 MG/ML IV BOLUS
INTRAVENOUS | Status: AC
  Filled 2018-10-02: qty 40

## 2018-10-02 MED ORDER — SODIUM CHLORIDE 0.9 % IV SOLN
INTRAVENOUS | Status: DC
  Administered 2018-10-02: 1000 mL via INTRAVENOUS

## 2018-10-02 MED ORDER — PHENYLEPHRINE HCL 10 MG/ML IJ SOLN
INTRAMUSCULAR | Status: DC | PRN
  Administered 2018-10-02: 100 ug via INTRAVENOUS

## 2018-10-02 NOTE — Anesthesia Postprocedure Evaluation (Signed)
Anesthesia Post Note  Patient: Yvonne Jones  Procedure(s) Performed: COLONOSCOPY WITH PROPOFOL (N/A )  Patient location during evaluation: Endoscopy Anesthesia Type: General Level of consciousness: awake and alert Pain management: pain level controlled Vital Signs Assessment: post-procedure vital signs reviewed and stable Respiratory status: spontaneous breathing, nonlabored ventilation, respiratory function stable and patient connected to nasal cannula oxygen Cardiovascular status: blood pressure returned to baseline and stable Postop Assessment: no apparent nausea or vomiting Anesthetic complications: no     Last Vitals:  Vitals:   10/02/18 1030 10/02/18 1040  BP: 118/68 127/76  Pulse:    Resp:  17  Temp:    SpO2:  99%    Last Pain:  Vitals:   10/02/18 1016  TempSrc: Tympanic  PainSc:                  Lenard Simmer

## 2018-10-02 NOTE — Op Note (Signed)
[facility] Patient Name: Yvonne Jones Procedure Date: 10/02/2018 9:39 AM MRN: [id number] Account #: [id number] Date of Birth: 1966-09-25 Admit Type: Outpatient Age: 52 Room: [facility] ENDO ROOM 2 Gender: Female Note Status: Finalized Procedure:            Colonoscopy Indications:          Screening for colorectal malignant neoplasm Providers:            Lin Landsman MD, MD Referring MD:         Cletis Athens, MD (Referring MD) Medicines:            Monitored Anesthesia Care Complications:        No immediate complications. Estimated blood loss: None. Procedure:            Pre-Anesthesia Assessment:                       - Prior to the procedure, a History and Physical was                        performed, and patient medications and allergies were                        reviewed. The patient is competent. The risks and                        benefits of the procedure and the sedation options and                        risks were discussed with the patient. All questions                        were answered and informed consent was obtained.                        Patient identification and proposed procedure were                        verified by the physician, the nurse, the                        anesthesiologist, the anesthetist and the technician in                        the pre-procedure area in the procedure room in the                        endoscopy suite. Mental Status Examination: alert and                        oriented. Airway Examination: normal oropharyngeal                        airway and neck mobility. Respiratory Examination:                        clear to auscultation. CV Examination: normal.                        Prophylactic Antibiotics: The patient does not require  prophylactic antibiotics. Prior Anticoagulants: The                        patient has taken no previous anticoagulant or                antiplatelet agents. ASA Grade Assessment: II - A                        patient with mild systemic disease. After reviewing the                        risks and benefits, the patient was deemed in                        satisfactory condition to undergo the procedure. The                        anesthesia plan was to use monitored anesthesia care                        (MAC). Immediately prior to administration of                        medications, the patient was re-assessed for adequacy                        to receive sedatives. The heart rate, respiratory rate,                        oxygen saturations, blood pressure, adequacy of                        pulmonary ventilation, and response to care were                        monitored throughout the procedure. The physical status                        of the patient was re-assessed after the procedure.                       After obtaining informed consent, the colonoscope was                        passed under direct vision. Throughout the procedure,                        the patient's blood pressure, pulse, and oxygen                        saturations were monitored continuously. The                        Colonoscope was introduced through the anus and                        advanced to the 10 cm into the ileum. The colonoscopy                        was performed without difficulty. The patient  tolerated                        the procedure well. The quality of the bowel                        preparation was evaluated using the BBPS [facility] Bowel                        Preparation Scale) with scores of: Right Colon = 3,                        Transverse Colon = 3 and Left Colon = 3 (entire mucosa                        seen well with no residual staining, small fragments of                        stool or opaque liquid). The total BBPS score equals 9. Findings:      The perianal and digital rectal examinations  were normal. Pertinent       negatives include normal sphincter tone and no palpable rectal lesions.      The terminal ileum and ileocecal valve contained a few scattered       non-bleeding aphthae. No stigmata of recent bleeding were seen. Biopsies       were performed with a cold forceps for evaluation of inflammatory bowel       disease.      Normal mucosa was found in the entire colon. Biopsies were taken with a       cold forceps for histology.      The retroflexed view of the distal rectum and anal verge was normal and       showed no anal or rectal abnormalities. Impression:           - Aphtha in the terminal ileum and at the ileocecal                        valve. Biopsied.                       - Normal mucosa in the entire examined colon. Biopsied.                       - The distal rectum and anal verge are normal on                        retroflexion view. Recommendation:       - Discharge patient to home (with spouse).                       - Resume previous diet today.                       - Continue present medications.                       - Await pathology results.                       - Repeat colonoscopy in 10 years for surveillance.                       -  Return to my office as previously scheduled. Procedure Code(s):    --- Professional ---                       [phone], Colonoscopy, flexible; with biopsy, single or                        multiple Diagnosis Code(s):    --- Professional ---                       Z12.11, Encounter for screening for malignant neoplasm                        of colon                       K63.89, Other specified diseases of intestine CPT copyright 2018 American Medical Association. All rights reserved. The codes documented in this report are preliminary and upon coder review may  be revised to meet current compliance requirements. Dr. Ulyess Mort Lin Landsman MD, MD 10/02/2018 10:15:11 AM This report has been signed  electronically. Number of Addenda: 0 Note Initiated On: 10/02/2018 9:39 AM Scope Withdrawal Time: 0 hours 10 minutes 53 seconds  Total Procedure Duration: 0 hours 14 minutes 55 seconds       [facility]

## 2018-10-02 NOTE — Anesthesia Post-op Follow-up Note (Signed)
Anesthesia QCDR form completed.        

## 2018-10-02 NOTE — Transfer of Care (Signed)
Immediate Anesthesia Transfer of Care Note  Patient: Yvonne Jones  Procedure(s) Performed: COLONOSCOPY WITH PROPOFOL (N/A )  Patient Location: PACU  Anesthesia Type:General  Level of Consciousness: sedated  Airway & Oxygen Therapy: Patient Spontanous Breathing and Patient connected to nasal cannula oxygen  Post-op Assessment: Report given to RN and Post -op Vital signs reviewed and stable  Post vital signs: Reviewed and stable  Last Vitals:  Vitals Value Taken Time  BP    Temp    Pulse 69 10/02/2018 10:16 AM  Resp 15 10/02/2018 10:16 AM  SpO2 99 % 10/02/2018 10:16 AM  Vitals shown include unvalidated device data.  Last Pain:  Vitals:   10/02/18 0923  TempSrc: Tympanic  PainSc: 0-No pain         Complications: No apparent anesthesia complications

## 2018-10-02 NOTE — H&P (Signed)
Arlyss Repress, MD [address]  [address]  [place], [place] [address]  Main: [phone]  Fax: [phone] Pager: [phone]  Primary Care Physician:  Corky Downs, MD Primary Gastroenterologist:  Dr. Arlyss Repress  Pre-Procedure History & Physical: HPI:  Yvonne Jones is a 53 y.o. female is here for an colonoscopy.   Past Medical History:  Diagnosis Date  . Asthma   . Environmental allergies   . GERD (gastroesophageal reflux disease)     Past Surgical History:  Procedure Laterality Date  . LAPAROSCOPY      Prior to Admission medications   Medication Sig Start Date End Date Taking? Authorizing Provider  ALBUTEROL SULFATE HFA IN Inhale into the lungs.    [provider]  budesonide-formoterol (SYMBICORT) 160-4.5 MCG/ACT inhaler Inhale 2 puffs into the lungs 2 (two) times daily.    [provider]  clobetasol ointment (TEMOVATE) 0.05 % Apply 1 application topically 2 (two) times daily. 07/25/18   Doreene Burke, CNM  fluconazole (DIFLUCAN) 150 MG tablet  08/14/16   [provider]  fluticasone (FLONASE) 50 MCG/ACT nasal spray instill 1 spray into each nostril twice a day 07/18/16   [provider]  montelukast (SINGULAIR) 10 MG tablet Take 10 mg by mouth at bedtime.    [provider]    Allergies as of 09/24/2018  . (No Known Allergies)    Family History  Problem Relation Age of Onset  . Heart disease Mother   . Diabetes Father     Social History   Socioeconomic History  . Marital status: Married    Spouse name: Not on file  . Number of children: Not on file  . Years of education: Not on file  . Highest education level: Not on file  Occupational History  . Not on file  Social Needs  . Financial resource strain: Not on file  . Food insecurity:    Worry: Not on file    Inability: Not on file  . Transportation needs:    Medical: Not on file    Non-medical: Not on file  Tobacco Use  . Smoking status:  Never Smoker  . Smokeless tobacco: Never Used  Substance and Sexual Activity  . Alcohol use: Never    Frequency: Never  . Drug use: Never  . Sexual activity: Yes    Birth control/protection: Post-menopausal  Lifestyle  . Physical activity:    Days per week: Not on file    Minutes per session: Not on file  . Stress: Not on file  Relationships  . Social connections:    Talks on phone: Not on file    Gets together: Not on file    Attends religious service: Not on file    Active member of club or organization: Not on file    Attends meetings of clubs or organizations: Not on file    Relationship status: Not on file  . Intimate partner violence:    Fear of current or ex partner: Not on file    Emotionally abused: Not on file    Physically abused: Not on file    Forced sexual activity: Not on file  Other Topics Concern  . Not on file  Social History Narrative  . Not on file    Review of Systems: See HPI, otherwise negative ROS  Physical Exam: BP 123/76   Pulse 67   Temp (!) 96.5 F (35.8 C) (Tympanic)   Resp 20   Ht 5\' 5"  (  1.651 m)   Wt 72.1 kg   SpO2 100%   BMI 26.46 kg/m  General:   Alert,  pleasant and cooperative in NAD Head:  Normocephalic and atraumatic. Neck:  Supple; no masses or thyromegaly. Lungs:  Clear throughout to auscultation.    Heart:  Regular rate and rhythm. Abdomen:  Soft, nontender and nondistended. Normal bowel sounds, without guarding, and without rebound.   Neurologic:  Alert and  oriented x4;  grossly normal neurologically.  Impression/Plan: Yvonne Jones is here for an colonoscopy to be performed for colon cancer screening  Risks, benefits, limitations, and alternatives regarding  colonoscopy have been reviewed with the patient.  Questions have been answered.  All parties agreeable.   Lannette Donath, MD  10/02/2018, 9:41 AM

## 2018-10-02 NOTE — Anesthesia Preprocedure Evaluation (Addendum)
Anesthesia Evaluation  Patient identified by MRN, date of birth, ID band Patient awake    Reviewed: Allergy & Precautions, H&P , NPO status , Patient's Chart, lab work & pertinent test results, reviewed documented beta blocker date and time   History of Anesthesia Complications Negative for: history of anesthetic complications  Airway Mallampati: III  TM Distance: >3 FB Neck ROM: full    Dental  (+) Dental Advidsory Given, Caps, Missing   Pulmonary asthma ,           Cardiovascular Exercise Tolerance: Good negative cardio ROS       Neuro/Psych negative neurological ROS  negative psych ROS   GI/Hepatic Neg liver ROS, GERD  ,  Endo/Other  negative endocrine ROS  Renal/GU negative Renal ROS  negative genitourinary   Musculoskeletal   Abdominal   Peds  Hematology negative hematology ROS (+)   Anesthesia Other Findings Past Medical History: No date: Asthma No date: Environmental allergies No date: GERD (gastroesophageal reflux disease)   Reproductive/Obstetrics negative OB ROS                            Anesthesia Physical Anesthesia Plan  ASA: II  Anesthesia Plan: General   Post-op Pain Management:    Induction: Intravenous  PONV Risk Score and Plan: 3 and Propofol infusion and TIVA  Airway Management Planned: Natural Airway and Nasal Cannula  Additional Equipment:   Intra-op Plan:   Post-operative Plan:   Informed Consent: I have reviewed the patients History and Physical, chart, labs and discussed the procedure including the risks, benefits and alternatives for the proposed anesthesia with the patient or authorized representative who has indicated his/her understanding and acceptance.   Dental Advisory Given  Plan Discussed with: Anesthesiologist, CRNA and Surgeon  Anesthesia Plan Comments:         Anesthesia Quick Evaluation

## 2018-10-03 ENCOUNTER — Encounter: Payer: Self-pay | Admitting: Gastroenterology

## 2018-10-06 ENCOUNTER — Ambulatory Visit: Payer: BLUE CROSS/BLUE SHIELD

## 2018-10-06 ENCOUNTER — Other Ambulatory Visit: Payer: Self-pay | Admitting: Internal Medicine

## 2018-10-06 DIAGNOSIS — R42 Dizziness and giddiness: Secondary | ICD-10-CM

## 2018-10-07 LAB — SURGICAL PATHOLOGY

## 2018-10-09 ENCOUNTER — Encounter: Payer: Self-pay | Admitting: Gastroenterology

## 2018-10-16 ENCOUNTER — Ambulatory Visit: Payer: BLUE CROSS/BLUE SHIELD | Admitting: Gastroenterology

## 2018-11-12 ENCOUNTER — Other Ambulatory Visit: Payer: Self-pay | Admitting: Internal Medicine

## 2018-12-11 ENCOUNTER — Ambulatory Visit: Payer: BLUE CROSS/BLUE SHIELD | Admitting: Gastroenterology

## 2019-01-29 ENCOUNTER — Ambulatory Visit: Payer: BLUE CROSS/BLUE SHIELD | Admitting: Gastroenterology

## 2019-02-02 ENCOUNTER — Other Ambulatory Visit: Payer: Self-pay | Admitting: Certified Nurse Midwife

## 2020-02-14 ENCOUNTER — Ambulatory Visit: Payer: BLUE CROSS/BLUE SHIELD | Attending: Internal Medicine

## 2020-02-14 DIAGNOSIS — Z23 Encounter for immunization: Secondary | ICD-10-CM | POA: Insufficient documentation

## 2020-02-14 NOTE — Progress Notes (Signed)
   Covid-19 Vaccination Clinic  Name:  Yvonne Jones    MRN: [id number] DOB: 10-Apr-1966  02/14/2020  [name] was observed post Covid-19 immunization for 15 minutes without incidence. She was provided with Vaccine Information Sheet and instruction to access the V-Safe system.   [name] was instructed to call 911 with any severe reactions post vaccine: Marland Kitchen Difficulty breathing  . Swelling of your face and throat  . A fast heartbeat  . A bad rash all over your body  . Dizziness and weakness    Immunizations Administered    Name Date Dose VIS Date Route   Pfizer COVID-19 Vaccine 02/14/2020 11:03 AM 0.3 mL 12/04/2019 Intramuscular   Manufacturer: [employer], [employer]   Lot: [id number]   NDC: [address]-4

## 2020-03-08 ENCOUNTER — Ambulatory Visit: Payer: Self-pay | Attending: Internal Medicine

## 2020-03-08 DIAGNOSIS — Z23 Encounter for immunization: Secondary | ICD-10-CM

## 2020-03-08 NOTE — Progress Notes (Signed)
   Covid-19 Vaccination Clinic  Name:  Yvonne Jones    MRN: [id number] DOB: 01-03-1966  03/08/2020  Ms. Rens was observed post Covid-19 immunization for 15 minutes without incident. She was provided with Vaccine Information Sheet and instruction to access the V-Safe system.   Ms. Routson was instructed to call 911 with any severe reactions post vaccine: Marland Kitchen Difficulty breathing  . Swelling of face and throat  . A fast heartbeat  . A bad rash all over body  . Dizziness and weakness   Immunizations Administered    Name Date Dose VIS Date Route   Pfizer COVID-19 Vaccine 03/08/2020 12:27 PM 0.3 mL 12/04/2019 Intramuscular   Manufacturer: [employer], [employer]   Lot: [id number]   NDC: [address]-3

## 2020-06-30 ENCOUNTER — Other Ambulatory Visit: Payer: Self-pay

## 2020-06-30 ENCOUNTER — Ambulatory Visit: Payer: Self-pay | Admitting: Internal Medicine

## 2020-09-01 ENCOUNTER — Other Ambulatory Visit: Payer: Self-pay

## 2020-09-01 ENCOUNTER — Ambulatory Visit: Payer: BLUE CROSS/BLUE SHIELD | Admitting: Internal Medicine

## 2020-09-29 ENCOUNTER — Encounter: Payer: Self-pay | Admitting: Nurse Practitioner

## 2020-09-29 ENCOUNTER — Ambulatory Visit: Payer: BLUE CROSS/BLUE SHIELD | Admitting: Nurse Practitioner

## 2020-09-29 VITALS — BP 123/71 | HR 72 | Temp 97.5°F | Resp 16 | Ht 65.0 in | Wt 162.0 lb

## 2020-09-29 DIAGNOSIS — K219 Gastro-esophageal reflux disease without esophagitis: Secondary | ICD-10-CM | POA: Diagnosis not present

## 2020-09-29 DIAGNOSIS — R5383 Other fatigue: Secondary | ICD-10-CM | POA: Diagnosis not present

## 2020-09-29 DIAGNOSIS — Z1231 Encounter for screening mammogram for malignant neoplasm of breast: Secondary | ICD-10-CM

## 2020-09-29 DIAGNOSIS — Z9109 Other allergy status, other than to drugs and biological substances: Secondary | ICD-10-CM | POA: Diagnosis not present

## 2020-09-29 NOTE — Progress Notes (Signed)
[facility] [address] [place], [place] [address]  Internal MEDICINE  Office Visit Note   Patient Name: Yvonne Jones  [id number]  [id number]  Date of Service: 10/16/2020   Complaints/HPI Pt is here for establishment of PCP. Chief Complaint  Patient presents with  . New Patient (Initial Visit)    has had high BP, was taking natural medicine  . Gastroesophageal Reflux  . Quality Metric Gaps    tetnaus, mammogram, flu  . Sleep Apnea    cant sleep sometimes  . Asthma  . Fatigue  . Anxiety  . controlled substance policy    reviewed with PT   The patient is here to establish primary care provider. She has history of hypertension. Was treated with medication for a little while, however, she is no taking natural supplements to help keep blood pressure under control. She is eating healthy, including low sodium and low fat diet. She drinks plenty of water. She is due to have routine, fasting labs and screening mammogram. She has no concerns or complaints today.    Current Medication: Outpatient Encounter Medications as of 09/29/2020  Medication Sig Note  . ALBUTEROL SULFATE HFA IN Inhale into the lungs.   . budesonide-formoterol (SYMBICORT) 160-4.5 MCG/ACT inhaler Inhale 2 puffs into the lungs 2 (two) times daily.   Marland Kitchen omeprazole (PRILOSEC) 20 MG capsule Take 20 mg by mouth daily as needed.   . fluticasone (FLONASE) 50 MCG/ACT nasal spray instill 1 spray into each nostril twice a day (Patient not taking: Reported on 09/29/2020) 08/22/2016: Received from: External Pharmacy  . montelukast (SINGULAIR) 10 MG tablet Take 10 mg by mouth at bedtime.   . [DISCONTINUED] clobetasol ointment (TEMOVATE) 0.05 % Apply 1 application topically 2 (two) times daily.   . [DISCONTINUED] fluconazole (DIFLUCAN) 150 MG tablet  09/17/2016: Received from: External Pharmacy   No facility-administered encounter medications on file as of 09/29/2020.    Surgical History: Past Surgical History:   Procedure Laterality Date  . COLONOSCOPY WITH PROPOFOL N/A 10/02/2018   Procedure: COLONOSCOPY WITH PROPOFOL;  Surgeon: Toney Reil, MD;  Location: [facility] ENDOSCOPY;  Service: Gastroenterology;  Laterality: N/A;  . LAPAROSCOPY      Medical History: Past Medical History:  Diagnosis Date  . Asthma   . Environmental allergies   . GERD (gastroesophageal reflux disease)     Family History: Family History  Problem Relation Age of Onset  . Heart disease Mother   . Diabetes Father   . Hypertension Father   . Breast cancer Other     Social History   Socioeconomic History  . Marital status: Married    Spouse name: Not on file  . Number of children: Not on file  . Years of education: Not on file  . Highest education level: Not on file  Occupational History  . Not on file  Tobacco Use  . Smoking status: Never Smoker  . Smokeless tobacco: Never Used  Vaping Use  . Vaping Use: Never used  Substance and Sexual Activity  . Alcohol use: Never  . Drug use: Never  . Sexual activity: Yes    Birth control/protection: Post-menopausal  Other Topics Concern  . Not on file  Social History Narrative  . Not on file   Social Determinants of Health   Financial Resource Strain:   . Difficulty of Paying Living Expenses: Not on file  Food Insecurity:   . Worried About Programme researcher, broadcasting/film/video in the Last Year: Not on file  .  Ran Out of Food in the Last Year: Not on file  Transportation Needs:   . Lack of Transportation (Medical): Not on file  . Lack of Transportation (Non-Medical): Not on file  Physical Activity:   . Days of Exercise per Week: Not on file  . Minutes of Exercise per Session: Not on file  Stress:   . Feeling of Stress : Not on file  Social Connections:   . Frequency of Communication with Friends and Family: Not on file  . Frequency of Social Gatherings with Friends and Family: Not on file  . Attends Religious Services: Not on file  . Active Member of Clubs or  Organizations: Not on file  . Attends Banker Meetings: Not on file  . Marital Status: Not on file  Intimate Partner Violence:   . Fear of Current or Ex-Partner: Not on file  . Emotionally Abused: Not on file  . Physically Abused: Not on file  . Sexually Abused: Not on file     Review of Systems  Constitutional: Negative for activity change, chills, fatigue and unexpected weight change.  HENT: Negative for congestion, postnasal drip, rhinorrhea, sneezing and sore throat.   Respiratory: Negative for cough, chest tightness, shortness of breath and wheezing.   Cardiovascular: Negative for chest pain and palpitations.  Gastrointestinal: Negative for abdominal pain, constipation, diarrhea, nausea and vomiting.  Endocrine: Negative for cold intolerance, heat intolerance, polydipsia and polyuria.  Musculoskeletal: Negative for arthralgias, back pain, joint swelling, neck pain and neck stiffness.  Skin: Negative for rash.  Allergic/Immunologic: Negative for environmental allergies.  Neurological: Negative for dizziness, tremors, numbness and headaches.  Hematological: Negative for adenopathy. Does not bruise/bleed easily.  Psychiatric/Behavioral: Negative for behavioral problems (Depression), sleep disturbance and suicidal ideas. The patient is not nervous/anxious.     Today's Vitals   09/29/20 1031  BP: 123/71  Pulse: 72  Resp: 16  Temp: (!) 97.5 F (36.4 C)  SpO2: 97%  Weight: 162 lb (73.5 kg)  Height: 5\' 5"  (1.651 m)   Body mass index is 26.96 kg/m.  Physical Exam Vitals and nursing note reviewed.  Constitutional:      General: She is not in acute distress.    Appearance: Normal appearance. She is well-developed. She is not diaphoretic.  HENT:     Head: Normocephalic and atraumatic.     Nose: Nose normal.     Mouth/Throat:     Pharynx: No oropharyngeal exudate.  Eyes:     Pupils: Pupils are equal, round, and reactive to light.  Neck:     Thyroid: No  thyromegaly.     Vascular: No carotid bruit or JVD.     Trachea: No tracheal deviation.  Cardiovascular:     Rate and Rhythm: Normal rate and regular rhythm.     Heart sounds: Normal heart sounds. No murmur heard.  No friction rub. No gallop.   Pulmonary:     Effort: Pulmonary effort is normal. No respiratory distress.     Breath sounds: Normal breath sounds. No wheezing or rales.  Chest:     Chest wall: No tenderness.  Abdominal:     Palpations: Abdomen is soft.  Musculoskeletal:        General: Normal range of motion.     Cervical back: Normal range of motion and neck supple.  Lymphadenopathy:     Cervical: No cervical adenopathy.  Skin:    General: Skin is warm and dry.  Neurological:     General: No focal  deficit present.     Mental Status: She is alert and oriented to person, place, and time.     Cranial Nerves: No cranial nerve deficit.  Psychiatric:        Mood and Affect: Mood normal.        Behavior: Behavior normal.        Thought Content: Thought content normal.        Judgment: Judgment normal.   Assessment/Plan:  1. Other fatigue Labs ordered including anemia and thyroid panels for further evaluation.   2. Gastroesophageal reflux disease without esophagitis May continue omeprazole daily.   3. Environmental allergies Continue allergy medicaiton and inhalers as prescribed.   4. Encounter for screening mammogram for malignant neoplasm of breast - MM DIGITAL SCREENING BILATERAL; Future   General Counseling: Yvonne Jones verbalizes understanding of the findings of todays visit and agrees with plan of treatment. I have discussed any further diagnostic evaluation that may be needed or ordered today. We also reviewed her medications today. she has been encouraged to call the office with any questions or concerns that should arise related to todays visit.    Counseling:  This patient was seen by Vincent Gros FNP Collaboration with Dr Lyndon Code as a part of  collaborative care agreement  Orders Placed This Encounter  Procedures  . MM DIGITAL SCREENING BILATERAL     Time spent: 45 Minutes

## 2020-09-30 ENCOUNTER — Other Ambulatory Visit: Payer: Self-pay | Admitting: Nurse Practitioner

## 2020-10-01 LAB — HGB A1C W/O EAG: Hgb A1c MFr Bld: 5.7 % — ABNORMAL HIGH (ref 4.8–5.6)

## 2020-10-01 LAB — CBC
Hematocrit: 40.7 % (ref 34.0–46.6)
Hemoglobin: 13.7 g/dL (ref 11.1–15.9)
MCH: 29.2 pg (ref 26.6–33.0)
MCHC: 33.7 g/dL (ref 31.5–35.7)
MCV: 87 fL (ref 79–97)
Platelets: 312 10*3/uL (ref 150–450)
RBC: 4.69 x10E6/uL (ref 3.77–5.28)
RDW: 12.6 % (ref 11.7–15.4)
WBC: 6.3 10*3/uL (ref 3.4–10.8)

## 2020-10-01 LAB — COMPREHENSIVE METABOLIC PANEL
ALT: 18 IU/L (ref 0–32)
AST: 21 IU/L (ref 0–40)
Albumin/Globulin Ratio: 1.5 (ref 1.2–2.2)
Albumin: 4.6 g/dL (ref 3.8–4.9)
Alkaline Phosphatase: 89 IU/L (ref 44–121)
BUN/Creatinine Ratio: 14 (ref 9–23)
BUN: 10 mg/dL (ref 6–24)
Bilirubin Total: 0.4 mg/dL (ref 0.0–1.2)
CO2: 25 mmol/L (ref 20–29)
Calcium: 9.7 mg/dL (ref 8.7–10.2)
Chloride: 100 mmol/L (ref 96–106)
Creatinine, Ser: 0.73 mg/dL (ref 0.57–1.00)
GFR calc Af Amer: 107 mL/min/{1.73_m2} (ref >59)
GFR calc non Af Amer: 93 mL/min/{1.73_m2} (ref >59)
Globulin, Total: 3.1 g/dL (ref 1.5–4.5)
Glucose: 81 mg/dL (ref 65–99)
Potassium: 4.4 mmol/L (ref 3.5–5.2)
Sodium: 140 mmol/L (ref 134–144)
Total Protein: 7.7 g/dL (ref 6.0–8.5)

## 2020-10-01 LAB — TSH: TSH: 2.28 u[IU]/mL (ref 0.450–4.500)

## 2020-10-01 LAB — FERRITIN: Ferritin: 60 ng/mL (ref 15–150)

## 2020-10-01 LAB — LIPID PANEL WITH LDL/HDL RATIO
Cholesterol, Total: 218 mg/dL — ABNORMAL HIGH (ref 100–199)
HDL: 70 mg/dL (ref >39)
LDL Chol Calc (NIH): 136 mg/dL — ABNORMAL HIGH (ref 0–99)
LDL/HDL Ratio: 1.9 ratio (ref 0.0–3.2)
Triglycerides: 67 mg/dL (ref 0–149)
VLDL Cholesterol Cal: 12 mg/dL (ref 5–40)

## 2020-10-01 LAB — IRON AND TIBC
Iron Saturation: 29 % (ref 15–55)
Iron: 92 ug/dL (ref 27–159)
Total Iron Binding Capacity: 314 ug/dL (ref 250–450)
UIBC: 222 ug/dL (ref 131–425)

## 2020-10-01 LAB — B12 AND FOLATE PANEL
Folate: >20 ng/mL (ref >3.0)
Vitamin B-12: 571 pg/mL (ref 232–1245)

## 2020-10-01 LAB — VITAMIN D 25 HYDROXY (VIT D DEFICIENCY, FRACTURES): Vit D, 25-Hydroxy: 40.1 ng/mL (ref 30.0–100.0)

## 2020-10-01 LAB — T4, FREE: Free T4: 1.51 ng/dL (ref 0.82–1.77)

## 2020-10-03 ENCOUNTER — Other Ambulatory Visit (INDEPENDENT_AMBULATORY_CARE_PROVIDER_SITE_OTHER): Payer: BLUE CROSS/BLUE SHIELD

## 2020-10-03 ENCOUNTER — Other Ambulatory Visit: Payer: Self-pay

## 2020-10-03 DIAGNOSIS — Z23 Encounter for immunization: Secondary | ICD-10-CM

## 2020-10-16 DIAGNOSIS — Z1231 Encounter for screening mammogram for malignant neoplasm of breast: Secondary | ICD-10-CM | POA: Insufficient documentation

## 2020-10-16 DIAGNOSIS — R5383 Other fatigue: Secondary | ICD-10-CM | POA: Insufficient documentation

## 2020-11-10 ENCOUNTER — Ambulatory Visit (INDEPENDENT_AMBULATORY_CARE_PROVIDER_SITE_OTHER): Payer: BLUE CROSS/BLUE SHIELD | Admitting: Nurse Practitioner

## 2020-11-10 ENCOUNTER — Other Ambulatory Visit: Payer: Self-pay

## 2020-11-10 VITALS — BP 125/66 | HR 92 | Temp 98.3°F | Resp 16 | Ht 65.0 in | Wt 159.4 lb

## 2020-11-10 DIAGNOSIS — R300 Dysuria: Secondary | ICD-10-CM

## 2020-11-10 DIAGNOSIS — Z23 Encounter for immunization: Secondary | ICD-10-CM | POA: Diagnosis not present

## 2020-11-10 DIAGNOSIS — J300 Vasomotor rhinitis: Secondary | ICD-10-CM

## 2020-11-10 DIAGNOSIS — E782 Mixed hyperlipidemia: Secondary | ICD-10-CM

## 2020-11-10 DIAGNOSIS — Z0001 Encounter for general adult medical examination with abnormal findings: Secondary | ICD-10-CM | POA: Diagnosis not present

## 2020-11-10 MED ORDER — FLUTICASONE PROPIONATE 50 MCG/ACT NA SUSP: NASAL | 3 refills | Status: AC

## 2020-11-10 NOTE — Progress Notes (Signed)
[facility] [place], [place] [address]  Internal MEDICINE  Office Visit Note  Patient Name: Yvonne Jones  [id number]  [id number]  Date of Service: 12/07/2020   Pt is here for routine health maintenance examination  Chief Complaint  Patient presents with  . Annual Exam  . Gastroesophageal Reflux  . controlled substance form    reviewed with PT  . Back Pain  . Allergies    bothering her needs nasal spray     The patient presents for annual physical. She states that she has had some nasal congestion, likely due to allergies. She has no headache, fever, chills, or evidence of illness. The congestion comes and goes. Generally gets worse during colder days.  She has had routine, fasting labs done. Her LDL was 130 and total cholesterol was 218. Her LDL/HDL ratio is 1.9. her glucose was 81 with HgbA1c at 5.7. we have discussed diet and lifestyle modifications to help lower these numbers without adding medications.  She had mammogram ordered at initial visit. She needs to have this scheduled.  She would like to get a flu shot today.     Current Medication: Outpatient Encounter Medications as of 11/10/2020  Medication Sig Note  . ALBUTEROL SULFATE HFA IN Inhale into the lungs.   . budesonide-formoterol (SYMBICORT) 160-4.5 MCG/ACT inhaler Inhale 2 puffs into the lungs 2 (two) times daily.   . fluticasone (FLONASE) 50 MCG/ACT nasal spray instill 1 spray into each nostril twice a day   . omeprazole (PRILOSEC) 20 MG capsule Take 20 mg by mouth daily as needed.   . [DISCONTINUED] fluticasone (FLONASE) 50 MCG/ACT nasal spray instill 1 spray into each nostril twice a day 08/22/2016: Received from: External Pharmacy  . [DISCONTINUED] montelukast (SINGULAIR) 10 MG tablet Take 10 mg by mouth at bedtime.    No facility-administered encounter medications on file as of 11/10/2020.    Surgical History: Past Surgical History:  Procedure Laterality Date  .  COLONOSCOPY WITH PROPOFOL N/A 10/02/2018   Procedure: COLONOSCOPY WITH PROPOFOL;  Surgeon: Lin Landsman, MD;  Location: [facility] ENDOSCOPY;  Service: Gastroenterology;  Laterality: N/A;  . LAPAROSCOPY      Medical History: Past Medical History:  Diagnosis Date  . Asthma   . Environmental allergies   . GERD (gastroesophageal reflux disease)     Family History: Family History  Problem Relation Age of Onset  . Heart disease Mother   . Diabetes Father   . Hypertension Father   . Breast cancer Other       Review of Systems  Constitutional: Negative for activity change, chills, fatigue and unexpected weight change.  HENT: Positive for congestion and postnasal drip. Negative for rhinorrhea, sneezing and sore throat.   Respiratory: Negative for cough, chest tightness, shortness of breath and wheezing.   Cardiovascular: Negative for chest pain and palpitations.  Gastrointestinal: Negative for abdominal pain, constipation, diarrhea, nausea and vomiting.  Endocrine: Negative for cold intolerance, heat intolerance, polydipsia and polyuria.  Genitourinary: Negative for dysuria, frequency, hematuria and urgency.  Musculoskeletal: Negative for arthralgias, back pain, joint swelling, neck pain and neck stiffness.  Skin: Negative for rash.  Allergic/Immunologic: Positive for environmental allergies.  Neurological: Negative for dizziness, tremors, numbness and headaches.  Hematological: Negative for adenopathy. Does not bruise/bleed easily.  Psychiatric/Behavioral: Negative for behavioral problems (Depression), sleep disturbance and suicidal ideas. The patient is not nervous/anxious.      Today's Vitals   11/10/20 0941  BP: 125/66  Pulse: 92  Resp: 16  Temp: 98.3 F (36.8 C)  SpO2: 98%  Weight: 159 lb 6.4 oz (72.3 kg)  Height: $Remove'5\' 5"'UlOHVrm$  (1.651 m)   Body mass index is 26.53 kg/m.  Physical Exam Vitals and nursing note reviewed.  Constitutional:      General: She is not in acute  distress.    Appearance: Normal appearance. She is well-developed and well-nourished. She is not diaphoretic.  HENT:     Head: Normocephalic and atraumatic.     Right Ear: External ear normal.     Left Ear: External ear normal.     Nose: Nose normal.     Mouth/Throat:     Mouth: Oropharynx is clear and moist.     Pharynx: No oropharyngeal exudate.  Eyes:     Extraocular Movements: EOM normal.     Pupils: Pupils are equal, round, and reactive to light.  Neck:     Thyroid: No thyromegaly.     Vascular: No carotid bruit or JVD.     Trachea: No tracheal deviation.  Cardiovascular:     Rate and Rhythm: Normal rate and regular rhythm.     Pulses: Normal pulses.     Heart sounds: Normal heart sounds. No murmur heard. No friction rub. No gallop.   Pulmonary:     Effort: Pulmonary effort is normal. No respiratory distress.     Breath sounds: Normal breath sounds. No wheezing or rales.  Chest:     Chest wall: No tenderness.  Abdominal:     General: Bowel sounds are normal.     Palpations: Abdomen is soft.     Tenderness: There is no abdominal tenderness.  Musculoskeletal:        General: Normal range of motion.     Cervical back: Normal range of motion and neck supple.  Lymphadenopathy:     Cervical: No cervical adenopathy.  Skin:    General: Skin is warm and dry.  Neurological:     General: No focal deficit present.     Mental Status: She is alert and oriented to person, place, and time.     Cranial Nerves: No cranial nerve deficit.  Psychiatric:        Mood and Affect: Mood and affect and mood normal.        Behavior: Behavior normal.        Thought Content: Thought content normal.        Judgment: Judgment normal.      LABS: Recent Results (from the past 2160 hour(s))  Comprehensive metabolic panel     Status: None   Collection Time: 09/30/20  9:28 AM  Result Value Ref Range   Glucose 81 65 - 99 mg/dL   BUN 10 6 - 24 mg/dL   Creatinine, Ser 0.73 0.57 - 1.00 mg/dL    GFR calc non Af Amer 93 >59 mL/min/1.73   GFR calc Af Amer 107 >59 mL/min/1.73    Comment: **[facility] currently reports eGFR in compliance with the current**   recommendations of the [employer]. [facility] will   update reporting as new guidelines are published from the NKF-ASN   Task force.    BUN/Creatinine Ratio 14 9 - 23   Sodium 140 134 - 144 mmol/L   Potassium 4.4 3.5 - 5.2 mmol/L   Chloride 100 96 - 106 mmol/L   CO2 25 20 - 29 mmol/L   Calcium 9.7 8.7 - 10.2 mg/dL   Total Protein 7.7 6.0 - 8.5 g/dL   Albumin  4.6 3.8 - 4.9 g/dL   Globulin, Total 3.1 1.5 - 4.5 g/dL   Albumin/Globulin Ratio 1.5 1.2 - 2.2   Bilirubin Total 0.4 0.0 - 1.2 mg/dL   Alkaline Phosphatase 89 44 - 121 IU/L    Comment:               **Please note reference interval change**   AST 21 0 - 40 IU/L   ALT 18 0 - 32 IU/L  CBC     Status: None   Collection Time: 09/30/20  9:28 AM  Result Value Ref Range   WBC 6.3 3.4 - 10.8 x10E3/uL   RBC 4.69 3.77 - 5.28 x10E6/uL   Hemoglobin 13.7 11.1 - 15.9 g/dL   Hematocrit 40.7 34.0 - 46.6 %   MCV 87 79 - 97 fL   MCH 29.2 26.6 - 33.0 pg   MCHC 33.7 31.5 - 35.7 g/dL   RDW 12.6 11.7 - 15.4 %   Platelets 312 150 - 450 x10E3/uL  Lipid Panel With LDL/HDL Ratio     Status: Abnormal   Collection Time: 09/30/20  9:28 AM  Result Value Ref Range   Cholesterol, Total 218 (H) 100 - 199 mg/dL   Triglycerides 67 0 - 149 mg/dL   HDL 70 >39 mg/dL   VLDL Cholesterol Cal 12 5 - 40 mg/dL   LDL Chol Calc (NIH) 136 (H) 0 - 99 mg/dL   LDL/HDL Ratio 1.9 0.0 - 3.2 ratio    Comment:                                     LDL/HDL Ratio                                             Men  Women                               1/2 Avg.Risk  1.0    1.5                                   Avg.Risk  3.6    3.2                                2X Avg.Risk  6.2    5.0                                3X Avg.Risk  8.0    6.1   Iron and TIBC     Status: None   Collection Time: 09/30/20  9:28 AM   Result Value Ref Range   Total Iron Binding Capacity 314 250 - 450 ug/dL   UIBC 222 131 - 425 ug/dL   Iron 92 27 - 159 ug/dL   Iron Saturation 29 15 - 55 %  B12 and Folate Panel     Status: None   Collection Time: 09/30/20  9:28 AM  Result Value Ref Range   Vitamin B-12 571 232 - 1,245 pg/mL   Folate >20.0 >3.0 ng/mL  Comment: A serum folate concentration of less than 3.1 ng/mL is considered to represent clinical deficiency.   Hgb A1c w/o eAG     Status: Abnormal   Collection Time: 09/30/20  9:28 AM  Result Value Ref Range   Hgb A1c MFr Bld 5.7 (H) 4.8 - 5.6 %    Comment:          Prediabetes: 5.7 - 6.4          Diabetes: >6.4          Glycemic control for adults with diabetes: <7.0   T4, free     Status: None   Collection Time: 09/30/20  9:28 AM  Result Value Ref Range   Free T4 1.51 0.82 - 1.77 ng/dL  TSH     Status: None   Collection Time: 09/30/20  9:28 AM  Result Value Ref Range   TSH 2.280 0.450 - 4.500 uIU/mL  VITAMIN D 25 Hydroxy (Vit-D Deficiency, Fractures)     Status: None   Collection Time: 09/30/20  9:28 AM  Result Value Ref Range   Vit D, 25-Hydroxy 40.1 30.0 - 100.0 ng/mL    Comment: Vitamin D deficiency has been defined by the Centre and an Endocrine Society practice guideline as a level of serum 25-OH vitamin D less than 20 ng/mL (1,2). The Endocrine Society went on to further define vitamin D insufficiency as a level between 21 and 29 ng/mL (2). 1. IOM (Institute of Medicine). 2010. Dietary reference    intakes for calcium and D. [place]: The    [employer]. 2. Holick MF, Binkley Darke, Bischoff-Ferrari HA, et al.    Evaluation, treatment, and prevention of vitamin D    deficiency: an Endocrine Society clinical practice    guideline. JCEM. 2011 Jul; 96(7):1911-30.   Ferritin     Status: None   Collection Time: 09/30/20  9:28 AM  Result Value Ref Range   Ferritin 60 15 - 150 ng/mL  UA/M w/rflx Culture, Routine      Status: None   Collection Time: 11/10/20  9:42 AM   Specimen: Urine   Urine  Result Value Ref Range   Specific Gravity, UA 1.011 1.005 - 1.030   pH, UA 5.5 5.0 - 7.5   Color, UA Yellow Yellow   Appearance Ur Clear Clear   Leukocytes,UA Negative Negative   Protein,UA Negative Negative/Trace   Glucose, UA Negative Negative   Ketones, UA Negative Negative   RBC, UA Negative Negative   Bilirubin, UA Negative Negative   Urobilinogen, Ur 0.2 0.2 - 1.0 mg/dL   Nitrite, UA Negative Negative   Microscopic Examination Comment     Comment: Microscopic follows if indicated.   Microscopic Examination See below:     Comment: Microscopic was indicated and was performed.   Urinalysis Reflex Comment     Comment: This specimen will not reflex to a Urine Culture.  Microscopic Examination     Status: None   Collection Time: 11/10/20  9:42 AM   Urine  Result Value Ref Range   WBC, UA None seen 0 - 5 /hpf   RBC None seen 0 - 2 /hpf   Epithelial Cells (non renal) None seen 0 - 10 /hpf   Casts None seen None seen /lpf   Bacteria, UA None seen None seen/Few    Assessment/Plan: 1. Encounter for general adult medical examination with abnormal findings Annual health maintenance exam today   2. Vasomotor rhinitis Add flonase nasal spray.  Use two sprays in both nostrils daily. Recommend OTC treatment such as claritin or zyrtec every day.  - fluticasone (FLONASE) 50 MCG/ACT nasal spray; instill 1 spray into each nostril twice a day  Dispense: 16 g; Refill: 3  3. Mixed hyperlipidemia Reviewed labs showing mild elevation of LDL and total cholesterol. Prudent diet information provided today.   4. Needs flu shot Flu vaccine administered in the office today.  - Flu Vaccine MDCK QUAD PF  5. Dysuria - UA/M w/rflx Culture, Routine  General Counseling: Kaylei verbalizes understanding of the findings of todays visit and agrees with plan of treatment. I have discussed any further diagnostic evaluation that  may be needed or ordered today. We also reviewed her medications today. she has been encouraged to call the office with any questions or concerns that should arise related to todays visit.    Counseling:  This patient was seen by Leretha Pol FNP Collaboration with Dr Lavera Guise as a part of collaborative care agreement  Orders Placed This Encounter  Procedures  . Microscopic Examination  . Flu Vaccine MDCK QUAD PF  . UA/M w/rflx Culture, Routine    Meds ordered this encounter  Medications  . fluticasone (FLONASE) 50 MCG/ACT nasal spray    Sig: instill 1 spray into each nostril twice a day    Dispense:  16 g    Refill:  3    Order Specific Question:   Supervising Provider    Answer:   Lavera Guise [[id number]]    Total time spent: 16 Minutes  Time spent includes review of chart, medications, test results, and follow up plan with the patient.     Lavera Guise, MD  Internal Medicine

## 2020-11-11 LAB — UA/M W/RFLX CULTURE, ROUTINE
Bilirubin, UA: NEGATIVE
Glucose, UA: NEGATIVE
Ketones, UA: NEGATIVE
Leukocytes,UA: NEGATIVE
Nitrite, UA: NEGATIVE
Protein,UA: NEGATIVE
RBC, UA: NEGATIVE
Specific Gravity, UA: 1.011 (ref 1.005–1.030)
Urobilinogen, Ur: 0.2 mg/dL (ref 0.2–1.0)
pH, UA: 5.5 (ref 5.0–7.5)

## 2020-11-11 LAB — MICROSCOPIC EXAMINATION
Bacteria, UA: NONE SEEN
Casts: NONE SEEN /lpf
Epithelial Cells (non renal): NONE SEEN /hpf (ref 0–10)
RBC, Urine: NONE SEEN /hpf (ref 0–2)
WBC, UA: NONE SEEN /hpf (ref 0–5)

## 2020-11-22 ENCOUNTER — Other Ambulatory Visit: Payer: Self-pay | Admitting: *Deleted

## 2020-11-22 MED ORDER — MONTELUKAST SODIUM 10 MG PO TABS
10.0000 mg | ORAL_TABLET | Freq: Every day | ORAL | 6 refills | Status: DC
Start: 2020-11-22 — End: 2021-04-05

## 2020-12-07 ENCOUNTER — Encounter: Payer: Self-pay | Admitting: Nurse Practitioner

## 2020-12-07 DIAGNOSIS — E782 Mixed hyperlipidemia: Secondary | ICD-10-CM | POA: Insufficient documentation

## 2020-12-07 DIAGNOSIS — Z23 Encounter for immunization: Secondary | ICD-10-CM | POA: Insufficient documentation

## 2020-12-07 DIAGNOSIS — J300 Vasomotor rhinitis: Secondary | ICD-10-CM | POA: Insufficient documentation

## 2020-12-07 DIAGNOSIS — Z0001 Encounter for general adult medical examination with abnormal findings: Secondary | ICD-10-CM | POA: Insufficient documentation

## 2020-12-07 DIAGNOSIS — R300 Dysuria: Secondary | ICD-10-CM | POA: Insufficient documentation

## 2021-04-05 ENCOUNTER — Ambulatory Visit (INDEPENDENT_AMBULATORY_CARE_PROVIDER_SITE_OTHER): Payer: 59 | Admitting: Internal Medicine

## 2021-04-05 ENCOUNTER — Other Ambulatory Visit: Payer: Self-pay

## 2021-04-05 VITALS — BP 132/74 | HR 64 | Temp 97.7°F | Resp 16 | Ht 65.0 in | Wt 155.7 lb

## 2021-04-05 DIAGNOSIS — K219 Gastro-esophageal reflux disease without esophagitis: Secondary | ICD-10-CM | POA: Diagnosis not present

## 2021-04-05 DIAGNOSIS — J300 Vasomotor rhinitis: Secondary | ICD-10-CM

## 2021-04-05 DIAGNOSIS — J45909 Unspecified asthma, uncomplicated: Secondary | ICD-10-CM | POA: Diagnosis not present

## 2021-04-05 DIAGNOSIS — E782 Mixed hyperlipidemia: Secondary | ICD-10-CM

## 2021-04-05 DIAGNOSIS — J301 Allergic rhinitis due to pollen: Secondary | ICD-10-CM

## 2021-04-05 MED ORDER — MONTELUKAST SODIUM 10 MG PO TABS: 10.0000 mg | ORAL_TABLET | Freq: Every day | ORAL | 6 refills | Status: DC

## 2021-04-05 MED ORDER — BUDESONIDE-FORMOTEROL FUMARATE 160-4.5 MCG/ACT IN AERO: 2.0000 | INHALATION_SPRAY | Freq: Two times a day (BID) | RESPIRATORY_TRACT | 6 refills | Status: DC

## 2021-04-07 ENCOUNTER — Encounter: Payer: Self-pay | Admitting: Internal Medicine

## 2021-04-07 NOTE — Assessment & Plan Note (Signed)
Stable at the present time.  She has been checked for allergy testing, she is not taking any shots the present time

## 2021-04-07 NOTE — Assessment & Plan Note (Signed)
No wheezing noted on today's examination.  Throat was not congested

## 2021-04-07 NOTE — Assessment & Plan Note (Signed)
Hypercholesterolemia  I advised the patient to follow Mediterranean diet This diet is rich in fruits vegetables and whole grain, and This diet is also rich in fish and lean meat Patient should also eat a handful of almonds or walnuts daily Recent heart study indicated that average follow-up on this kind of diet reduces the cardiovascular mortality by 50 to 70%== 

## 2021-04-07 NOTE — Progress Notes (Signed)
Established Patient Office Visit  Subjective:  Patient ID: Yvonne Jones, female    DOB: 22-Jan-1966  Age: 55 y.o. MRN: [id number]  CC:  Chief Complaint  Patient presents with  . Sore Throat    Pt has a sore throat     HPI  Yvonne Jones presents for URI, he complains of chest congestion sore throat coughing up mucus denies any history of fever chills or headache loss of smell.  There is no history of wheezing there is no history of swelling of the legs stomach pain or GERD  Past Medical History:  Diagnosis Date  . Asthma   . Environmental allergies   . GERD (gastroesophageal reflux disease)     Past Surgical History:  Procedure Laterality Date  . COLONOSCOPY WITH PROPOFOL N/A 10/02/2018   Procedure: COLONOSCOPY WITH PROPOFOL;  Surgeon: Toney Reil, MD;  Location: [facility] ENDOSCOPY;  Service: Gastroenterology;  Laterality: N/A;  . LAPAROSCOPY      Family History  Problem Relation Age of Onset  . Heart disease Mother   . Diabetes Father   . Hypertension Father   . Breast cancer Other     Social History   Socioeconomic History  . Marital status: Married    Spouse name: Not on file  . Number of children: Not on file  . Years of education: Not on file  . Highest education level: Not on file  Occupational History  . Not on file  Tobacco Use  . Smoking status: Never Smoker  . Smokeless tobacco: Never Used  Vaping Use  . Vaping Use: Never used  Substance and Sexual Activity  . Alcohol use: Never  . Drug use: Never  . Sexual activity: Yes    Birth control/protection: Post-menopausal  Other Topics Concern  . Not on file  Social History Narrative  . Not on file   Social Determinants of Health   Financial Resource Strain: Not on file  Food Insecurity: Not on file  Transportation Needs: Not on file  Physical Activity: Not on file  Stress: Not on file  Social Connections: Not on file  Intimate Partner Violence: Not on file     Current Outpatient  Medications:  .  ALBUTEROL SULFATE HFA IN, Inhale into the lungs., Disp: , Rfl:  .  budesonide-formoterol (SYMBICORT) 160-4.5 MCG/ACT inhaler, Inhale 2 puffs into the lungs 2 (two) times daily., Disp: 1 each, Rfl: 6 .  fluticasone (FLONASE) 50 MCG/ACT nasal spray, instill 1 spray into each nostril twice a day, Disp: 16 g, Rfl: 3 .  montelukast (SINGULAIR) 10 MG tablet, Take 1 tablet (10 mg total) by mouth at bedtime., Disp: 30 tablet, Rfl: 6 .  omeprazole (PRILOSEC) 20 MG capsule, Take 20 mg by mouth daily as needed., Disp: , Rfl:    No Known Allergies  ROS Review of Systems  Constitutional: Negative.  Negative for appetite change, chills, fatigue and fever.  HENT: Negative.  Negative for ear discharge.   Eyes: Negative.  Negative for pain.  Respiratory: Positive for cough. Negative for choking, shortness of breath and wheezing.   Cardiovascular: Negative.   Gastrointestinal: Negative.   Endocrine: Negative.   Genitourinary: Negative.   Musculoskeletal: Negative.  Negative for back pain.  Skin: Negative.   Allergic/Immunologic: Negative.   Neurological: Negative.  Negative for tremors.  Hematological: Negative.   Psychiatric/Behavioral: Negative.   All other systems reviewed and are negative.     Objective:    Physical Exam  BP 132/74  Pulse 64   Temp 97.7 F (36.5 C)   Resp 16   Ht 5\' 5"  (1.651 m)   Wt 155 lb 11.2 oz (70.6 kg)   SpO2 98%   BMI 25.91 kg/m  Wt Readings from Last 3 Encounters:  04/05/21 155 lb 11.2 oz (70.6 kg)  11/10/20 159 lb 6.4 oz (72.3 kg)  09/29/20 162 lb (73.5 kg)     Health Maintenance Due  Topic Date Due  . HIV Screening  Never done  . TETANUS/TDAP  Never done  . MAMMOGRAM  08/11/2020    There are no preventive care reminders to display for this patient.  Lab Results  Component Value Date   TSH 2.280 09/30/2020   Lab Results  Component Value Date   WBC 6.3 09/30/2020   HGB 13.7 09/30/2020   HCT 40.7 09/30/2020   MCV 87  09/30/2020   PLT 312 09/30/2020   Lab Results  Component Value Date   NA 140 09/30/2020   K 4.4 09/30/2020   CO2 25 09/30/2020   GLUCOSE 81 09/30/2020   BUN 10 09/30/2020   CREATININE 0.73 09/30/2020   BILITOT 0.4 09/30/2020   ALKPHOS 89 09/30/2020   AST 21 09/30/2020   ALT 18 09/30/2020   PROT 7.7 09/30/2020   ALBUMIN 4.6 09/30/2020   CALCIUM 9.7 09/30/2020   Lab Results  Component Value Date   CHOL 218 (H) 09/30/2020   Lab Results  Component Value Date   HDL 70 09/30/2020   Lab Results  Component Value Date   LDLCALC 136 (H) 09/30/2020   Lab Results  Component Value Date   TRIG 67 09/30/2020   No results found for: CHOLHDL Lab Results  Component Value Date   HGBA1C 5.7 (H) 09/30/2020      Assessment & Plan:   Problem List Items Addressed This Visit      Respiratory   Asthma    No wheezing noted on today's examination.  Throat was not congested      Relevant Medications   budesonide-formoterol (SYMBICORT) 160-4.5 MCG/ACT inhaler   montelukast (SINGULAIR) 10 MG tablet   Vasomotor rhinitis    Stable at the present time.  She has been checked for allergy testing, she is not taking any shots the present time        Digestive   GERD (gastroesophageal reflux disease)    - The patient's GERD is stable on medication.  - Instructed the patient to avoid eating spicy and acidic foods, as well as foods high in fat. - Instructed the patient to avoid eating large meals or meals 2-3 hours prior to sleeping.        Other   Mixed hyperlipidemia    Hypercholesterolemia  I advised the patient to follow Mediterranean diet This diet is rich in fruits vegetables and whole grain, and This diet is also rich in fish and lean meat Patient should also eat a handful of almonds or walnuts daily Recent heart study indicated that average follow-up on this kind of diet reduces the cardiovascular mortality by 50 to 70%==       Other Visit Diagnoses    Seasonal allergic  rhinitis due to pollen    -  Primary   Relevant Medications   budesonide-formoterol (SYMBICORT) 160-4.5 MCG/ACT inhaler   montelukast (SINGULAIR) 10 MG tablet      Meds ordered this encounter  Medications  . budesonide-formoterol (SYMBICORT) 160-4.5 MCG/ACT inhaler    Sig: Inhale 2 puffs into the lungs 2 (  two) times daily.    Dispense:  1 each    Refill:  6  . montelukast (SINGULAIR) 10 MG tablet    Sig: Take 1 tablet (10 mg total) by mouth at bedtime.    Dispense:  30 tablet    Refill:  6    Follow-up: No follow-ups on file.    Corky Downs, MD

## 2021-04-07 NOTE — Assessment & Plan Note (Signed)
-   The patient's GERD is stable on medication.  - Instructed the patient to avoid eating spicy and acidic foods, as well as foods high in fat. - Instructed the patient to avoid eating large meals or meals 2-3 hours prior to sleeping. 

## 2021-05-11 ENCOUNTER — Ambulatory Visit: Payer: BLUE CROSS/BLUE SHIELD | Admitting: Nurse Practitioner

## 2021-09-04 ENCOUNTER — Other Ambulatory Visit: Payer: Self-pay

## 2021-09-04 DIAGNOSIS — R0602 Shortness of breath: Secondary | ICD-10-CM

## 2021-09-04 MED ORDER — METHYLPREDNISOLONE 4 MG PO TBPK
ORAL_TABLET | ORAL | 0 refills | Status: DC
Start: 2021-09-04 — End: 2021-11-28

## 2021-09-04 MED ORDER — AZITHROMYCIN 250 MG PO TABS
ORAL_TABLET | ORAL | 0 refills | Status: AC
Start: 2021-09-04 — End: 2021-09-09

## 2021-09-05 ENCOUNTER — Ambulatory Visit
Admission: RE | Admit: 2021-09-05 | Discharge: 2021-09-05 | Disposition: A | Discharge status: Home / Self Care | Payer: 59 | Source: Ambulatory Visit | Attending: Internal Medicine | Admitting: Internal Medicine

## 2021-09-05 DIAGNOSIS — R0602 Shortness of breath: Secondary | ICD-10-CM

## 2021-09-18 ENCOUNTER — Other Ambulatory Visit: Payer: Self-pay

## 2021-09-18 ENCOUNTER — Ambulatory Visit (INDEPENDENT_AMBULATORY_CARE_PROVIDER_SITE_OTHER): Payer: 59

## 2021-09-18 DIAGNOSIS — Z23 Encounter for immunization: Secondary | ICD-10-CM

## 2021-11-13 ENCOUNTER — Encounter: Payer: BLUE CROSS/BLUE SHIELD | Admitting: Physician Assistant

## 2021-11-28 ENCOUNTER — Other Ambulatory Visit: Payer: Self-pay

## 2021-11-28 ENCOUNTER — Ambulatory Visit (INDEPENDENT_AMBULATORY_CARE_PROVIDER_SITE_OTHER): Payer: 59 | Admitting: Internal Medicine

## 2021-11-28 ENCOUNTER — Encounter: Payer: Self-pay | Admitting: Internal Medicine

## 2021-11-28 VITALS — BP 130/82 | HR 79 | Temp 98.3°F | Resp 16 | Ht 65.5 in | Wt 165.6 lb

## 2021-11-28 DIAGNOSIS — M2550 Pain in unspecified joint: Secondary | ICD-10-CM

## 2021-11-28 DIAGNOSIS — B370 Candidal stomatitis: Secondary | ICD-10-CM

## 2021-11-28 DIAGNOSIS — R682 Dry mouth, unspecified: Secondary | ICD-10-CM | POA: Diagnosis not present

## 2021-11-28 MED ORDER — NYSTATIN 100000 UNIT/ML MT SUSP: OROMUCOSAL | 0 refills | Status: DC

## 2021-11-28 MED ORDER — FLUCONAZOLE 150 MG PO TABS: ORAL_TABLET | ORAL | 0 refills | Status: DC

## 2021-11-28 MED ORDER — NYSTATIN 100000 UNIT/ML MT SUSP: OROMUCOSAL | 0 refills | Status: AC

## 2021-11-28 NOTE — Progress Notes (Signed)
[facility] [place], [place] [address]  Internal MEDICINE  Office Visit Note  Patient Name: Yvonne Jones  [id number]  [id number]  Date of Service: 12/28/2021  Chief Complaint  Patient presents with   burning sensation on tongue    Dry mouth at times started in October    HPI Pt is here for acute and sick visit Has h/o asthma, has been recently treated with couple of course of steroids. Feels better however c/o excessive dryness in her mouth, feels sore and has metallic taste and burning in her mouth  Denies any fever or chills     Current Medication: Outpatient Encounter Medications as of 11/28/2021  Medication Sig   ALBUTEROL SULFATE HFA IN Inhale into the lungs.   budesonide-formoterol (SYMBICORT) 160-4.5 MCG/ACT inhaler Inhale 2 puffs into the lungs 2 (two) times daily.   Calcium Carbonate-Vitamin D (CALTRATE 600+D PO) Take by mouth.   fluconazole (DIFLUCAN) 150 MG tablet Take one tab a day for 3 days   fluticasone (FLONASE) 50 MCG/ACT nasal spray instill 1 spray into each nostril twice a day   montelukast (SINGULAIR) 10 MG tablet Take 1 tablet (10 mg total) by mouth at bedtime.   Multiple Vitamin (MULTIVITAMIN) tablet Take 1 tablet by mouth daily.   Omega-3 Fatty Acids (FISH OIL CONCENTRATE PO) Take by mouth.   omeprazole (PRILOSEC) 20 MG capsule Take 20 mg by mouth daily as needed.   [DISCONTINUED] nystatin (MYCOSTATIN) 100000 UNIT/ML suspension Swish and swallow 5 cc after meals   nystatin (MYCOSTATIN) 100000 UNIT/ML suspension Swish and swallow 5 cc after meals   [DISCONTINUED] methylPREDNISolone (MEDROL DOSEPAK) 4 MG TBPK tablet Take by mouth as directed. (Patient not taking: Reported on 11/28/2021)   No facility-administered encounter medications on file as of 11/28/2021.    Surgical History: Past Surgical History:  Procedure Laterality Date   COLONOSCOPY WITH PROPOFOL N/A 10/02/2018   Procedure: COLONOSCOPY WITH PROPOFOL;  Surgeon:  Lin Landsman, MD;  Location: [facility] ENDOSCOPY;  Service: Gastroenterology;  Laterality: N/A;   LAPAROSCOPY      Medical History: Past Medical History:  Diagnosis Date   Asthma    Environmental allergies    GERD (gastroesophageal reflux disease)     Family History: Family History  Problem Relation Age of Onset   Heart disease Mother    Diabetes Father    Hypertension Father    Breast cancer Other     Social History   Socioeconomic History   Marital status: Married    Spouse name: Not on file   Number of children: Not on file   Years of education: Not on file   Highest education level: Not on file  Occupational History   Not on file  Tobacco Use   Smoking status: Never   Smokeless tobacco: Never  Vaping Use   Vaping Use: Never used  Substance and Sexual Activity   Alcohol use: Never   Drug use: Never   Sexual activity: Yes    Birth control/protection: Post-menopausal  Other Topics Concern   Not on file  Social History Narrative   Not on file   Social Determinants of Health   Financial Resource Strain: Not on file  Food Insecurity: Not on file  Transportation Needs: Not on file  Physical Activity: Not on file  Stress: Not on file  Social Connections: Not on file  Intimate Partner Violence: Not on file      Review of Systems  Constitutional:  Negative for  chills, fatigue and unexpected weight change.  HENT:  Positive for postnasal drip. Negative for congestion, rhinorrhea, sneezing and sore throat.   Eyes:  Negative for redness.  Respiratory:  Negative for cough, chest tightness and shortness of breath.   Cardiovascular:  Negative for chest pain and palpitations.  Gastrointestinal:  Negative for abdominal pain, constipation, diarrhea, nausea and vomiting.  Genitourinary:  Negative for dysuria and frequency.  Musculoskeletal:  Negative for arthralgias, back pain, joint swelling and neck pain.  Skin:  Negative for rash.       Dry mouth    Neurological: Negative.  Negative for tremors and numbness.  Hematological:  Negative for adenopathy. Does not bruise/bleed easily.  Psychiatric/Behavioral:  Negative for behavioral problems (Depression), sleep disturbance and suicidal ideas. The patient is not nervous/anxious.    Vital Signs: BP 130/82   Pulse 79   Temp 98.3 F (36.8 C)   Resp 16   Ht 5' 5.5" (1.664 m)   Wt 165 lb 9.6 oz (75.1 kg)   SpO2 99%   BMI 27.14 kg/m    Physical Exam Constitutional:      Appearance: Normal appearance.  HENT:     Head: Normocephalic and atraumatic.     Right Ear: Tympanic membrane normal.     Nose: Nose normal.     Mouth/Throat:     Mouth: Mucous membranes are dry.     Pharynx: Posterior oropharyngeal erythema present.  Eyes:     Extraocular Movements: Extraocular movements intact.     Pupils: Pupils are equal, round, and reactive to light.  Cardiovascular:     Pulses: Normal pulses.     Heart sounds: Normal heart sounds.  Pulmonary:     Effort: Pulmonary effort is normal.     Breath sounds: Normal breath sounds.  Neurological:     General: No focal deficit present.     Mental Status: She is alert.  Psychiatric:        Mood and Affect: Mood normal.        Behavior: Behavior normal.       Assessment/Plan: 1. Dry mouth Will check basic work up for [employer] syndrome - Sed Rate (ESR) - ANA w/Reflex if Positive; Future  2. Pain in joint, multiple sites - Sed Rate (ESR) - ANA w/Reflex if Positive; Future  3. Oral pharyngeal candidiasis Due to repeated use of steroids, pt has oral candidiasis  - fluconazole (DIFLUCAN) 150 MG tablet; Take one tab a day for 3 days  Dispense: 3 tablet; Refill: 0   General Counseling: Dayla verbalizes understanding of the findings of todays visit and agrees with plan of treatment. I have discussed any further diagnostic evaluation that may be needed or ordered today. We also reviewed her medications today. she has been encouraged to call  the office with any questions or concerns that should arise related to todays visit.    Orders Placed This Encounter  Procedures   Sed Rate (ESR)   ANA w/Reflex if Positive    Meds ordered this encounter  Medications   fluconazole (DIFLUCAN) 150 MG tablet    Sig: Take one tab a day for 3 days    Dispense:  3 tablet    Refill:  0   DISCONTD: nystatin (MYCOSTATIN) 100000 UNIT/ML suspension    Sig: Swish and swallow 5 cc after meals    Dispense:  60 mL    Refill:  0   nystatin (MYCOSTATIN) 100000 UNIT/ML suspension    Sig: Swish and swallow  5 cc after meals    Dispense:  60 mL    Refill:  0    Total time spent:30 Minutes Time spent includes review of chart, medications, test results, and follow up plan with the patient.   [place] Controlled Substance Database was reviewed by me.   Dr Lavera Guise Internal medicine

## 2021-11-29 LAB — SEDIMENTATION RATE: Sed Rate: 10 mm/hr (ref 0–40)

## 2022-01-22 ENCOUNTER — Telehealth: Payer: Self-pay

## 2022-01-22 NOTE — Telephone Encounter (Signed)
Left vm to confirm 01/25/22 appointment-Toni °

## 2022-01-25 ENCOUNTER — Encounter: Payer: Self-pay | Admitting: Physician Assistant

## 2022-01-25 ENCOUNTER — Ambulatory Visit (INDEPENDENT_AMBULATORY_CARE_PROVIDER_SITE_OTHER): Payer: Self-pay | Admitting: Physician Assistant

## 2022-01-25 ENCOUNTER — Other Ambulatory Visit: Payer: Self-pay

## 2022-01-25 DIAGNOSIS — J301 Allergic rhinitis due to pollen: Secondary | ICD-10-CM

## 2022-01-25 DIAGNOSIS — E538 Deficiency of other specified B group vitamins: Secondary | ICD-10-CM

## 2022-01-25 DIAGNOSIS — Z0001 Encounter for general adult medical examination with abnormal findings: Secondary | ICD-10-CM

## 2022-01-25 DIAGNOSIS — R079 Chest pain, unspecified: Secondary | ICD-10-CM | POA: Diagnosis not present

## 2022-01-25 DIAGNOSIS — M2550 Pain in unspecified joint: Secondary | ICD-10-CM | POA: Diagnosis not present

## 2022-01-25 DIAGNOSIS — R5383 Other fatigue: Secondary | ICD-10-CM

## 2022-01-25 DIAGNOSIS — E559 Vitamin D deficiency, unspecified: Secondary | ICD-10-CM

## 2022-01-25 DIAGNOSIS — R0602 Shortness of breath: Secondary | ICD-10-CM | POA: Diagnosis not present

## 2022-01-25 DIAGNOSIS — K219 Gastro-esophageal reflux disease without esophagitis: Secondary | ICD-10-CM | POA: Diagnosis not present

## 2022-01-25 DIAGNOSIS — E782 Mixed hyperlipidemia: Secondary | ICD-10-CM

## 2022-01-25 DIAGNOSIS — Z1231 Encounter for screening mammogram for malignant neoplasm of breast: Secondary | ICD-10-CM

## 2022-01-25 DIAGNOSIS — R300 Dysuria: Secondary | ICD-10-CM

## 2022-01-25 MED ORDER — BUDESONIDE-FORMOTEROL FUMARATE 160-4.5 MCG/ACT IN AERO: 2.0000 | INHALATION_SPRAY | Freq: Two times a day (BID) | RESPIRATORY_TRACT | 6 refills | Status: DC

## 2022-01-25 MED ORDER — OMEPRAZOLE 40 MG PO CPDR: 40.0000 mg | DELAYED_RELEASE_CAPSULE | Freq: Every day | ORAL | 3 refills | Status: AC

## 2022-01-25 MED ORDER — MONTELUKAST SODIUM 10 MG PO TABS: 10.0000 mg | ORAL_TABLET | Freq: Every day | ORAL | 6 refills | Status: DC

## 2022-01-25 NOTE — Progress Notes (Signed)
[facility] [place], [place] [address]  Internal MEDICINE  Office Visit Note  Patient Name: Yvonne Jones  [id number]  [id number]  Date of Service: 01/30/2022  Chief Complaint  Patient presents with   Annual Exam    Wants blood work, discomfort with heart, noticed it for a few months, feels better when pt exercises    Gastroesophageal Reflux   Asthma     HPI Pt is here for routine health maintenance examination -does note she was shivering and had a little toe spasm and took a magnesium supplement and it improved. Denies being cold or any fever -acid reflux is bothering her again, stopped taking medication for awhile because it improved, but now needs it again. She is taking 2m omeprazole  -states taking calcium makes bones better but triggers her reflux more, will work on increasing calcium in diet -breathing ok for the most part, sometimes has to use albuterol inhaler -States she has some tightness and discomfort in chest at times for the past few months, not really painful or beating fast--will check EKG which did show sinus rhythm, but with short PR interval. Will order echo to further evaluate -Due for routine blood work, ESR previously ordered was normal -Due for colonoscopy and mammogram  Current Medication: Outpatient Encounter Medications as of 01/25/2022  Medication Sig   ALBUTEROL SULFATE HFA IN Inhale into the lungs.   Calcium Carbonate-Vitamin D (CALTRATE 600+D PO) Take by mouth.   fluconazole (DIFLUCAN) 150 MG tablet Take one tab a day for 3 days   fluticasone (FLONASE) 50 MCG/ACT nasal spray instill 1 spray into each nostril twice a day   Multiple Vitamin (MULTIVITAMIN) tablet Take 1 tablet by mouth daily.   nystatin (MYCOSTATIN) 100000 UNIT/ML suspension Swish and swallow 5 cc after meals   Omega-3 Fatty Acids (FISH OIL CONCENTRATE PO) Take by mouth.   omeprazole (PRILOSEC) 40 MG capsule Take 1 capsule (40 mg total) by mouth daily.    [DISCONTINUED] budesonide-formoterol (SYMBICORT) 160-4.5 MCG/ACT inhaler Inhale 2 puffs into the lungs 2 (two) times daily.   [DISCONTINUED] montelukast (SINGULAIR) 10 MG tablet Take 1 tablet (10 mg total) by mouth at bedtime.   [DISCONTINUED] omeprazole (PRILOSEC) 20 MG capsule Take 20 mg by mouth daily as needed.   budesonide-formoterol (SYMBICORT) 160-4.5 MCG/ACT inhaler Inhale 2 puffs into the lungs 2 (two) times daily.   montelukast (SINGULAIR) 10 MG tablet Take 1 tablet (10 mg total) by mouth at bedtime.   No facility-administered encounter medications on file as of 01/25/2022.    Surgical History: Past Surgical History:  Procedure Laterality Date   COLONOSCOPY WITH PROPOFOL N/A 10/02/2018   Procedure: COLONOSCOPY WITH PROPOFOL;  Surgeon: VLin Landsman MD;  [facility]  Service: Gastroenterology;  Laterality: N/A;   LAPAROSCOPY      Medical History: Past Medical History:  Diagnosis Date   Asthma    Environmental allergies    GERD (gastroesophageal reflux disease)     Family History: Family History  Problem Relation Age of Onset   Heart disease Mother    Diabetes Father    Hypertension Father    Breast cancer Other       Review of Systems  Constitutional:  Negative for activity change, chills, fatigue and unexpected weight change.  HENT:  Negative for congestion, postnasal drip, rhinorrhea, sneezing and sore throat.   Respiratory:  Positive for chest tightness. Negative for cough, shortness of breath and wheezing.   Cardiovascular:  Positive for  chest pain. Negative for palpitations.  Gastrointestinal:  Negative for abdominal pain, constipation, diarrhea, nausea and vomiting.       Reflux  Endocrine: Negative for cold intolerance, heat intolerance, polydipsia and polyuria.  Musculoskeletal:  Positive for arthralgias. Negative for back pain, joint swelling, neck pain and neck stiffness.  Skin:  Negative for rash.  Allergic/Immunologic: Negative for  environmental allergies.  Neurological:  Negative for dizziness, tremors, numbness and headaches.  Hematological:  Negative for adenopathy. Does not bruise/bleed easily.  Psychiatric/Behavioral:  Negative for behavioral problems (Depression), sleep disturbance and suicidal ideas. The patient is not nervous/anxious.     Vital Signs: BP 128/84    Pulse 88    Temp 98.7 F (37.1 C)    Resp 16    Ht _0  (1.651 m)    Wt 163 lb (73.9 kg)    SpO2 98%    BMI 27.12 kg/m    Physical Exam Vitals and nursing note reviewed.  Constitutional:      General: She is not in acute distress.    Appearance: Normal appearance. She is well-developed. She is not diaphoretic.  HENT:     Head: Normocephalic and atraumatic.     Right Ear: External ear normal.     Left Ear: External ear normal.     Nose: Nose normal.     Mouth/Throat:     Pharynx: No oropharyngeal exudate.  Eyes:     Pupils: Pupils are equal, round, and reactive to light.  Neck:     Thyroid: No thyromegaly.     Vascular: No carotid bruit or JVD.     Trachea: No tracheal deviation.  Cardiovascular:     Rate and Rhythm: Normal rate and regular rhythm.     Pulses: Normal pulses.     Heart sounds: Normal heart sounds. No murmur heard.   No friction rub. No gallop.  Pulmonary:     Effort: Pulmonary effort is normal. No respiratory distress.     Breath sounds: Normal breath sounds. No wheezing or rales.  Chest:     Chest wall: No tenderness.  Abdominal:     General: Bowel sounds are normal.     Palpations: Abdomen is soft.     Tenderness: There is no abdominal tenderness.  Musculoskeletal:        General: Normal range of motion.     Cervical back: Normal range of motion and neck supple.  Lymphadenopathy:     Cervical: No cervical adenopathy.  Skin:    General: Skin is warm and dry.  Neurological:     General: No focal deficit present.     Mental Status: She is alert and oriented to person, place, and time.     Cranial Nerves: No  cranial nerve deficit.  Psychiatric:        Mood and Affect: Mood normal.        Behavior: Behavior normal.        Thought Content: Thought content normal.        Judgment: Judgment normal.     LABS: Recent Results (from the past 2160 hour(s))  Sed Rate (ESR)     Status: None   Collection Time: 11/28/21  9:58 AM  Result Value Ref Range   Sed Rate 10 0 - 40 mm/hr  UA/M w/rflx Culture, Routine     Status: Abnormal   Collection Time: 01/25/22  3:29 PM   Specimen: Urine   Urine  Result Value Ref Range   Specific Gravity,  UA 1.018 1.005 - 1.030   pH, UA 6.0 5.0 - 7.5   Color, UA Yellow Yellow   Appearance Ur Turbid (A) Clear   Leukocytes,UA Negative Negative   Protein,UA Negative Negative/Trace   Glucose, UA Negative Negative   Ketones, UA Negative Negative   RBC, UA Negative Negative   Bilirubin, UA Negative Negative   Urobilinogen, Ur 0.2 0.2 - 1.0 mg/dL   Nitrite, UA Negative Negative   Microscopic Examination Comment     Comment: Microscopic follows if indicated.   Microscopic Examination See below:     Comment: Microscopic was indicated and was performed.   Urinalysis Reflex Comment     Comment: This specimen has reflexed to a Urine Culture.  Microscopic Examination     Status: Abnormal   Collection Time: 01/25/22  3:29 PM   Urine  Result Value Ref Range   WBC, UA 6-10 (A) 0 - 5 /hpf   RBC None seen 0 - 2 /hpf   Epithelial Cells (non renal) 0-10 0 - 10 /hpf   Casts None seen None seen /lpf   Bacteria, UA None seen None seen/Few  Urine Culture, Reflex     Status: None   Collection Time: 01/25/22  3:29 PM   Urine  Result Value Ref Range   Urine Culture, Routine Final report    Organism ID, Bacteria Comment     Comment: Mixed urogenital flora 25,000-50,000 colony forming units per mL         Assessment/Plan: 1. Encounter for general adult medical examination with abnormal findings CPE performed, routine fasting labs ordered, mammogram order placed  2.  Chest pain, unspecified type - EKG 12-Lead showed sinus rhythm with shortened PR interval.  We will go ahead and order echo for further evaluation - ECHOCARDIOGRAM COMPLETE; Future  3. SOB (shortness of breath) - ECHOCARDIOGRAM COMPLETE; Future  4. Pain in joint, multiple sites - ANA w/Reflex if Positive - Rheumatoid Factor  5. Gastroesophageal reflux disease without esophagitis We will restart on omeprazole 40 mg.  Patient requests referral to GI as she would like to have an updated endoscopy due to worsening reflux - omeprazole (PRILOSEC) 40 MG capsule; Take 1 capsule (40 mg total) by mouth daily.  Dispense: 30 capsule; Refill: 3 - Ambulatory referral to Gastroenterology  6. Seasonal allergic rhinitis due to pollen - budesonide-formoterol (SYMBICORT) 160-4.5 MCG/ACT inhaler; Inhale 2 puffs into the lungs 2 (two) times daily.  Dispense: 1 each; Refill: 6 - montelukast (SINGULAIR) 10 MG tablet; Take 1 tablet (10 mg total) by mouth at bedtime.  Dispense: 30 tablet; Refill: 6  7. Visit for screening mammogram - MM 3D SCREEN BREAST BILATERAL; Future  8. Vitamin D deficiency - VITAMIN D 25 Hydroxy (Vit-D Deficiency, Fractures)  9. B12 deficiency - B12 and Folate Panel  10. Mixed hyperlipidemia - Lipid Panel With LDL/HDL Ratio  11. Other fatigue - CBC w/Diff/Platelet - Comprehensive metabolic panel - TSH + free T4  12. Dysuria - UA/M w/rflx Culture, Routine   General Counseling: Sofiah verbalizes understanding of the findings of todays visit and agrees with plan of treatment. I have discussed any further diagnostic evaluation that may be needed or ordered today. We also reviewed her medications today. she has been encouraged to call the office with any questions or concerns that should arise related to todays visit.    Counseling:    Orders Placed This Encounter  Procedures   Microscopic Examination   Urine Culture, Reflex   MM 3D SCREEN  BREAST BILATERAL   UA/M w/rflx  Culture, Routine   CBC w/Diff/Platelet   Comprehensive metabolic panel   TSH + free T4   Lipid Panel With LDL/HDL Ratio   B12 and Folate Panel   VITAMIN D 25 Hydroxy (Vit-D Deficiency, Fractures)   ANA w/Reflex if Positive   Rheumatoid Factor   Ambulatory referral to Gastroenterology   EKG 12-Lead   ECHOCARDIOGRAM COMPLETE    Meds ordered this encounter  Medications   omeprazole (PRILOSEC) 40 MG capsule    Sig: Take 1 capsule (40 mg total) by mouth daily.    Dispense:  30 capsule    Refill:  3   budesonide-formoterol (SYMBICORT) 160-4.5 MCG/ACT inhaler    Sig: Inhale 2 puffs into the lungs 2 (two) times daily.    Dispense:  1 each    Refill:  6   montelukast (SINGULAIR) 10 MG tablet    Sig: Take 1 tablet (10 mg total) by mouth at bedtime.    Dispense:  30 tablet    Refill:  6    This patient was seen by Drema Dallas, PA-C in collaboration with Dr. Clayborn Bigness as a part of collaborative care agreement.  Total time spent:40 Minutes  Time spent includes review of chart, medications, test results, and follow up plan with the patient.     Lavera Guise, MD  Internal Medicine

## 2022-01-28 LAB — UA/M W/RFLX CULTURE, ROUTINE
Bilirubin, UA: NEGATIVE
Glucose, UA: NEGATIVE
Ketones, UA: NEGATIVE
Leukocytes,UA: NEGATIVE
Nitrite, UA: NEGATIVE
Protein,UA: NEGATIVE
RBC, UA: NEGATIVE
Specific Gravity, UA: 1.018 (ref 1.005–1.030)
Urobilinogen, Ur: 0.2 mg/dL (ref 0.2–1.0)
pH, UA: 6 (ref 5.0–7.5)

## 2022-01-28 LAB — MICROSCOPIC EXAMINATION
Bacteria, UA: NONE SEEN
Casts: NONE SEEN /lpf
RBC, Urine: NONE SEEN /hpf (ref 0–2)

## 2022-01-28 LAB — URINE CULTURE, REFLEX

## 2022-01-31 ENCOUNTER — Telehealth: Payer: Self-pay

## 2022-01-31 NOTE — Telephone Encounter (Signed)
CALLED PATIENT NO ANSWER LEFT VOICEMAIL FOR A CALL BACK ? ?

## 2022-02-01 ENCOUNTER — Telehealth: Payer: Self-pay

## 2022-02-01 NOTE — Telephone Encounter (Signed)
CALLED PATIENT NO ANSWER LEFT VOICEMAIL FOR A CALL BACK °Letter sent °

## 2022-02-12 ENCOUNTER — Telehealth: Payer: Self-pay

## 2022-02-12 NOTE — Telephone Encounter (Signed)
Left vm to confirm 02/14/22 appointment-Toni °

## 2022-02-14 ENCOUNTER — Other Ambulatory Visit: Payer: Self-pay

## 2022-02-22 ENCOUNTER — Ambulatory Visit: Payer: Self-pay | Admitting: Physician Assistant

## 2022-03-13 ENCOUNTER — Telehealth: Payer: Self-pay

## 2022-03-13 NOTE — Telephone Encounter (Signed)
Patient is actively seeing Dr.Masoud for primary care.  ?

## 2022-03-14 ENCOUNTER — Other Ambulatory Visit: Payer: Self-pay

## 2022-03-14 ENCOUNTER — Ambulatory Visit (INDEPENDENT_AMBULATORY_CARE_PROVIDER_SITE_OTHER): Payer: Self-pay | Admitting: *Deleted

## 2022-03-14 DIAGNOSIS — E782 Mixed hyperlipidemia: Secondary | ICD-10-CM

## 2022-03-14 DIAGNOSIS — R5383 Other fatigue: Secondary | ICD-10-CM

## 2022-03-14 DIAGNOSIS — R0602 Shortness of breath: Secondary | ICD-10-CM

## 2022-03-14 DIAGNOSIS — Z9109 Other allergy status, other than to drugs and biological substances: Secondary | ICD-10-CM

## 2022-03-14 DIAGNOSIS — K219 Gastro-esophageal reflux disease without esophagitis: Secondary | ICD-10-CM

## 2022-03-16 LAB — COMPLETE METABOLIC PANEL WITH GFR
AG Ratio: 1.4 (calc) (ref 1.0–2.5)
ALT: 14 U/L (ref 6–29)
AST: 18 U/L (ref 10–35)
Albumin: 4 g/dL (ref 3.6–5.1)
Alkaline phosphatase (APISO): 64 U/L (ref 37–153)
BUN: 12 mg/dL (ref 7–25)
CO2: 25 mmol/L (ref 20–32)
Calcium: 9.2 mg/dL (ref 8.6–10.4)
Chloride: 102 mmol/L (ref 98–110)
Creat: 0.69 mg/dL (ref 0.50–1.03)
Globulin: 2.9 g/dL (calc) (ref 1.9–3.7)
Glucose, Bld: 81 mg/dL (ref 65–99)
Potassium: 4 mmol/L (ref 3.5–5.3)
Sodium: 139 mmol/L (ref 135–146)
Total Bilirubin: 0.4 mg/dL (ref 0.2–1.2)
Total Protein: 6.9 g/dL (ref 6.1–8.1)
eGFR: 102 mL/min/{1.73_m2} (ref >=60)

## 2022-03-16 LAB — CBC WITH DIFFERENTIAL/PLATELET
Absolute Monocytes: 600 cells/uL (ref 200–950)
Basophils Absolute: 110 cells/uL (ref 0–200)
Basophils Relative: 1.6 %
Eosinophils Absolute: 200 cells/uL (ref 15–500)
Eosinophils Relative: 2.9 %
HCT: 39 % (ref 35.0–45.0)
Hemoglobin: 13 g/dL (ref 11.7–15.5)
Lymphs Abs: 1987 cells/uL (ref 850–3900)
MCH: 29.1 pg (ref 27.0–33.0)
MCHC: 33.3 g/dL (ref 32.0–36.0)
MCV: 87.2 fL (ref 80.0–100.0)
MPV: 10.9 fL (ref 7.5–12.5)
Monocytes Relative: 8.7 %
Neutro Abs: 4002 cells/uL (ref 1500–7800)
Neutrophils Relative %: 58 %
Platelets: 312 10*3/uL (ref 140–400)
RBC: 4.47 10*6/uL (ref 3.80–5.10)
RDW: 12.6 % (ref 11.0–15.0)
Total Lymphocyte: 28.8 %
WBC: 6.9 10*3/uL (ref 3.8–10.8)

## 2022-03-16 LAB — LIPID PANEL
Cholesterol: 196 mg/dL (ref <200)
HDL: 65 mg/dL (ref >=50)
LDL Cholesterol (Calc): 113 mg/dL (calc) — ABNORMAL HIGH
Non-HDL Cholesterol (Calc): 131 mg/dL (calc) — ABNORMAL HIGH (ref <130)
Total CHOL/HDL Ratio: 3 (calc) (ref <5.0)
Triglycerides: 81 mg/dL (ref <150)

## 2022-03-16 LAB — TSH: TSH: 2.04 mIU/L (ref 0.40–4.50)

## 2022-03-16 LAB — SEDIMENTATION RATE: Sed Rate: 19 mm/h (ref 0–30)

## 2022-03-16 LAB — ANTI-NUCLEAR AB-TITER (ANA TITER): ANA Titer 1: 1:40 {titer} — ABNORMAL HIGH

## 2022-03-16 LAB — ANA: Anti Nuclear Antibody (ANA): POSITIVE — AB

## 2022-03-16 LAB — B12 AND FOLATE PANEL
Folate: >24 ng/mL
Vitamin B-12: 548 pg/mL (ref 200–1100)

## 2022-03-16 LAB — RHEUMATOID FACTOR: Rheumatoid fact SerPl-aCnc: <14 IU/mL (ref <14)

## 2022-10-15 ENCOUNTER — Ambulatory Visit (INDEPENDENT_AMBULATORY_CARE_PROVIDER_SITE_OTHER): Payer: 59 | Admitting: Internal Medicine

## 2022-10-15 ENCOUNTER — Encounter: Payer: Self-pay | Admitting: Internal Medicine

## 2022-10-15 VITALS — BP 121/79 | HR 79 | Ht 65.0 in | Wt 156.4 lb

## 2022-10-15 DIAGNOSIS — R69 Illness, unspecified: Secondary | ICD-10-CM | POA: Diagnosis not present

## 2022-10-15 DIAGNOSIS — K219 Gastro-esophageal reflux disease without esophagitis: Secondary | ICD-10-CM

## 2022-10-15 DIAGNOSIS — F5101 Primary insomnia: Secondary | ICD-10-CM | POA: Diagnosis not present

## 2022-10-15 DIAGNOSIS — Z23 Encounter for immunization: Secondary | ICD-10-CM | POA: Diagnosis not present

## 2022-10-15 DIAGNOSIS — J45909 Unspecified asthma, uncomplicated: Secondary | ICD-10-CM | POA: Diagnosis not present

## 2022-10-15 MED ORDER — GABAPENTIN 100 MG PO CAPS: 100.0000 mg | ORAL_CAPSULE | Freq: Three times a day (TID) | ORAL | 3 refills | Status: DC

## 2022-10-15 NOTE — Progress Notes (Signed)
Established Patient Office Visit  Subjective:  Patient ID: Yvonne Jones, female    DOB: 1966-09-07  Age: 56 y.o. MRN: [id number]  CC: No chief complaint on file.   HPI  Yvonne Jones presents for check up  Past Medical History:  Diagnosis Date   Asthma    Environmental allergies    GERD (gastroesophageal reflux disease)     Past Surgical History:  Procedure Laterality Date   COLONOSCOPY WITH PROPOFOL N/A 10/02/2018   Procedure: COLONOSCOPY WITH PROPOFOL;  Surgeon: Lin Landsman, MD;  Location: [facility] ENDOSCOPY;  Service: Gastroenterology;  Laterality: N/A;   LAPAROSCOPY      Family History  Problem Relation Age of Onset   Heart disease Mother    Diabetes Father    Hypertension Father    Breast cancer Other     Social History   Socioeconomic History   Marital status: Married    Spouse name: Not on file   Number of children: Not on file   Years of education: Not on file   Highest education level: Not on file  Occupational History   Not on file  Tobacco Use   Smoking status: Never   Smokeless tobacco: Never  Vaping Use   Vaping Use: Never used  Substance and Sexual Activity   Alcohol use: Never   Drug use: Never   Sexual activity: Yes    Birth control/protection: Post-menopausal  Other Topics Concern   Not on file  Social History Narrative   Not on file   Social Determinants of Health   Financial Resource Strain: Not on file  Food Insecurity: Not on file  Transportation Needs: Not on file  Physical Activity: Not on file  Stress: Not on file  Social Connections: Not on file  Intimate Partner Violence: Not on file     Current Outpatient Medications:    gabapentin (NEURONTIN) 100 MG capsule, Take 1 capsule (100 mg total) by mouth 3 (three) times daily., Disp: 90 capsule, Rfl: 3   ALBUTEROL SULFATE HFA IN, Inhale into the lungs., Disp: , Rfl:    budesonide-formoterol (SYMBICORT) 160-4.5 MCG/ACT inhaler, Inhale 2 puffs into the lungs 2 (two)  times daily., Disp: 1 each, Rfl: 6   Calcium Carbonate-Vitamin D (CALTRATE 600+D PO), Take by mouth., Disp: , Rfl:    fluconazole (DIFLUCAN) 150 MG tablet, Take one tab a day for 3 days, Disp: 3 tablet, Rfl: 0   fluticasone (FLONASE) 50 MCG/ACT nasal spray, instill 1 spray into each nostril twice a day, Disp: 16 g, Rfl: 3   montelukast (SINGULAIR) 10 MG tablet, Take 1 tablet (10 mg total) by mouth at bedtime., Disp: 30 tablet, Rfl: 6   Multiple Vitamin (MULTIVITAMIN) tablet, Take 1 tablet by mouth daily., Disp: , Rfl:    nystatin (MYCOSTATIN) 100000 UNIT/ML suspension, Swish and swallow 5 cc after meals, Disp: 60 mL, Rfl: 0   Omega-3 Fatty Acids (FISH OIL CONCENTRATE PO), Take by mouth., Disp: , Rfl:    omeprazole (PRILOSEC) 40 MG capsule, Take 1 capsule (40 mg total) by mouth daily., Disp: 30 capsule, Rfl: 3   No Known Allergies  ROS Review of Systems  Constitutional: Negative.  Negative for appetite change and chills.  HENT: Negative.  Negative for ear discharge and ear pain.   Eyes: Negative.   Respiratory:  Positive for cough. Negative for shortness of breath and wheezing.   Cardiovascular:  Positive for leg swelling. Negative for chest pain.  Gastrointestinal: Negative.  Negative for  abdominal pain.  Endocrine: Negative.   Genitourinary: Negative.   Musculoskeletal:  Positive for arthralgias and myalgias. Negative for gait problem and joint swelling.  Skin: Negative.   Allergic/Immunologic: Positive for environmental allergies.  Neurological: Negative.  Negative for dizziness.  Hematological: Negative.   Psychiatric/Behavioral: Negative.  Negative for dysphoric mood.   All other systems reviewed and are negative.     Objective:    Physical Exam Vitals reviewed.  Constitutional:      Appearance: Normal appearance.  HENT:     Mouth/Throat:     Mouth: Mucous membranes are moist.  Eyes:     Pupils: Pupils are equal, round, and reactive to light.  Neck:     Vascular: No  carotid bruit.  Cardiovascular:     Rate and Rhythm: Normal rate and regular rhythm.     Pulses: Normal pulses.     Heart sounds: Normal heart sounds.  Pulmonary:     Effort: Pulmonary effort is normal.     Breath sounds: Normal breath sounds.  Abdominal:     General: Bowel sounds are normal.     Palpations: Abdomen is soft. There is no hepatomegaly, splenomegaly or mass.     Tenderness: There is no abdominal tenderness.     Hernia: No hernia is present.  Musculoskeletal:        General: No tenderness.     Cervical back: Neck supple.     Right lower leg: No edema.     Left lower leg: No edema.  Skin:    Findings: No rash.  Neurological:     Mental Status: She is alert and oriented to person, place, and time.     Motor: No weakness.  Psychiatric:        Mood and Affect: Mood and affect normal.        Behavior: Behavior normal.     BP 121/79   Pulse 79   Ht _0  (1.651 m)   Wt 156 lb 6.4 oz (70.9 kg)   BMI 26.03 kg/m  Wt Readings from Last 3 Encounters:  10/15/22 156 lb 6.4 oz (70.9 kg)  01/25/22 163 lb (73.9 kg)  11/28/21 165 lb 9.6 oz (75.1 kg)     Health Maintenance Due  Topic Date Due   HIV Screening  Never done   TETANUS/TDAP  Never done   Zoster Vaccines- Shingrix (1 of 2) Never done   MAMMOGRAM  08/11/2020   COVID-19 Vaccine (4 - Pfizer series) 11/28/2020    There are no preventive care reminders to display for this patient.  Lab Results  Component Value Date   TSH 2.04 03/14/2022   Lab Results  Component Value Date   WBC 6.9 03/14/2022   HGB 13.0 03/14/2022   HCT 39.0 03/14/2022   MCV 87.2 03/14/2022   PLT 312 03/14/2022   Lab Results  Component Value Date   NA 139 03/14/2022   K 4.0 03/14/2022   CO2 25 03/14/2022   GLUCOSE 81 03/14/2022   BUN 12 03/14/2022   CREATININE 0.69 03/14/2022   BILITOT 0.4 03/14/2022   ALKPHOS 89 09/30/2020   AST 18 03/14/2022   ALT 14 03/14/2022   PROT 6.9 03/14/2022   ALBUMIN 4.6 09/30/2020   CALCIUM  9.2 03/14/2022   EGFR 102 03/14/2022   Lab Results  Component Value Date   CHOL 196 03/14/2022   Lab Results  Component Value Date   HDL 65 03/14/2022   Lab Results  Component Value Date  LDLCALC 113 (H) 03/14/2022   Lab Results  Component Value Date   TRIG 81 03/14/2022   Lab Results  Component Value Date   CHOLHDL 3.0 03/14/2022   Lab Results  Component Value Date   HGBA1C 5.7 (H) 09/30/2020      Assessment & Plan:   Problem List Items Addressed This Visit       Respiratory   Asthma    Stable at the present time        Digestive   GERD (gastroesophageal reflux disease)    - The patient's GERD is stable on medication.  - Instructed the patient to avoid eating spicy and acidic foods, as well as foods high in fat. - Instructed the patient to avoid eating large meals or meals 2-3 hours prior to sleeping.      Other Visit Diagnoses     Need for influenza vaccination    -  Primary   Relevant Medications   gabapentin (NEURONTIN) 100 MG capsule   Other Relevant Orders   Flu Vaccine QUAD 6+ mos PF IM (Fluarix Quad PF) (Completed)   Primary insomnia       Relevant Medications   gabapentin (NEURONTIN) 100 MG capsule       Meds ordered this encounter  Medications   gabapentin (NEURONTIN) 100 MG capsule    Sig: Take 1 capsule (100 mg total) by mouth 3 (three) times daily.    Dispense:  90 capsule    Refill:  3    Follow-up: No follow-ups on file.    Cletis Athens, MD

## 2022-10-15 NOTE — Assessment & Plan Note (Signed)
-   The patient's GERD is stable on medication.  - Instructed the patient to avoid eating spicy and acidic foods, as well as foods high in fat. - Instructed the patient to avoid eating large meals or meals 2-3 hours prior to sleeping. 

## 2022-10-15 NOTE — Assessment & Plan Note (Signed)
Stable at the present time. 

## 2022-10-22 ENCOUNTER — Encounter: Payer: Self-pay | Admitting: Internal Medicine

## 2022-10-22 ENCOUNTER — Ambulatory Visit (INDEPENDENT_AMBULATORY_CARE_PROVIDER_SITE_OTHER): Payer: 59 | Admitting: Internal Medicine

## 2022-10-22 VITALS — BP 123/86 | HR 96 | Ht 65.0 in | Wt 156.5 lb

## 2022-10-22 DIAGNOSIS — J301 Allergic rhinitis due to pollen: Secondary | ICD-10-CM

## 2022-10-22 DIAGNOSIS — K219 Gastro-esophageal reflux disease without esophagitis: Secondary | ICD-10-CM

## 2022-10-22 DIAGNOSIS — J45909 Unspecified asthma, uncomplicated: Secondary | ICD-10-CM

## 2022-10-22 DIAGNOSIS — B370 Candidal stomatitis: Secondary | ICD-10-CM

## 2022-10-22 DIAGNOSIS — J300 Vasomotor rhinitis: Secondary | ICD-10-CM

## 2022-10-22 MED ORDER — FLUCONAZOLE 150 MG PO TABS: ORAL_TABLET | ORAL | 0 refills | Status: DC

## 2022-10-22 MED ORDER — METHYLPREDNISOLONE 4 MG PO TBPK: ORAL_TABLET | ORAL | 0 refills | Status: DC

## 2022-10-22 MED ORDER — MONTELUKAST SODIUM 10 MG PO TABS: 10.0000 mg | ORAL_TABLET | Freq: Every day | ORAL | 6 refills | Status: AC

## 2022-10-22 MED ORDER — NYSTATIN 100000 UNIT/ML MT SUSP: 5.0000 mL | Freq: Three times a day (TID) | OROMUCOSAL | 0 refills | Status: DC

## 2022-10-23 NOTE — Assessment & Plan Note (Signed)
Claritin 10 mg p.o. daily 

## 2022-10-23 NOTE — Assessment & Plan Note (Signed)
Started on prednisone

## 2022-10-23 NOTE — Assessment & Plan Note (Signed)
Chronic problem. 

## 2022-10-23 NOTE — Progress Notes (Signed)
Established Patient Office Visit  Subjective:  Patient ID: Yvonne Jones, female    DOB: 06-Oct-1966  Age: 56 y.o. MRN: [id number]  CC:  Chief Complaint  Patient presents with   Cough    Cough Associated symptoms include wheezing.    Dahlia Client presents for uri/ wheezing  Past Medical History:  Diagnosis Date   Asthma    Environmental allergies    GERD (gastroesophageal reflux disease)     Past Surgical History:  Procedure Laterality Date   COLONOSCOPY WITH PROPOFOL N/A 10/02/2018   Procedure: COLONOSCOPY WITH PROPOFOL;  Surgeon: Lin Landsman, MD;  Location: [facility] ENDOSCOPY;  Service: Gastroenterology;  Laterality: N/A;   LAPAROSCOPY      Family History  Problem Relation Age of Onset   Heart disease Mother    Diabetes Father    Hypertension Father    Breast cancer Other     Social History   Socioeconomic History   Marital status: Married    Spouse name: Not on file   Number of children: Not on file   Years of education: Not on file   Highest education level: Not on file  Occupational History   Not on file  Tobacco Use   Smoking status: Never   Smokeless tobacco: Never  Vaping Use   Vaping Use: Never used  Substance and Sexual Activity   Alcohol use: Never   Drug use: Never   Sexual activity: Yes    Birth control/protection: Post-menopausal  Other Topics Concern   Not on file  Social History Narrative   Not on file   Social Determinants of Health   Financial Resource Strain: Not on file  Food Insecurity: Not on file  Transportation Needs: Not on file  Physical Activity: Not on file  Stress: Not on file  Social Connections: Not on file  Intimate Partner Violence: Not on file     Current Outpatient Medications:    ALBUTEROL SULFATE HFA IN, Inhale into the lungs., Disp: , Rfl:    budesonide-formoterol (SYMBICORT) 160-4.5 MCG/ACT inhaler, Inhale 2 puffs into the lungs 2 (two) times daily., Disp: 1 each, Rfl: 6   Calcium  Carbonate-Vitamin D (CALTRATE 600+D PO), Take by mouth., Disp: , Rfl:    fluconazole (DIFLUCAN) 150 MG tablet, Take one tab a day for 3 days, Disp: 3 tablet, Rfl: 0   fluticasone (FLONASE) 50 MCG/ACT nasal spray, instill 1 spray into each nostril twice a day, Disp: 16 g, Rfl: 3   gabapentin (NEURONTIN) 100 MG capsule, Take 1 capsule (100 mg total) by mouth 3 (three) times daily., Disp: 90 capsule, Rfl: 3   magic mouthwash (nystatin, lidocaine, diphenhydrAMINE, alum & mag hydroxide) suspension, Swish and swallow 5 mLs 3 (three) times daily., Disp: 180 mL, Rfl: 0   methylPREDNISolone (MEDROL DOSEPAK) 4 MG TBPK tablet, As directed, Disp: 21 each, Rfl: 0   Multiple Vitamin (MULTIVITAMIN) tablet, Take 1 tablet by mouth daily., Disp: , Rfl:    nystatin (MYCOSTATIN) 100000 UNIT/ML suspension, Swish and swallow 5 cc after meals, Disp: 60 mL, Rfl: 0   Omega-3 Fatty Acids (FISH OIL CONCENTRATE PO), Take by mouth., Disp: , Rfl:    omeprazole (PRILOSEC) 40 MG capsule, Take 1 capsule (40 mg total) by mouth daily., Disp: 30 capsule, Rfl: 3   montelukast (SINGULAIR) 10 MG tablet, Take 1 tablet (10 mg total) by mouth at bedtime., Disp: 30 tablet, Rfl: 6   No Known Allergies  ROS Review of Systems  Respiratory:  Positive for cough and wheezing.       Objective:    Physical Exam Cardiovascular:     Rate and Rhythm: Normal rate.  Pulmonary:     Breath sounds: Wheezing present.  Musculoskeletal:        General: Normal range of motion.  Skin:    General: Skin is warm.  Neurological:     General: No focal deficit present.     BP 123/86   Pulse 96   Ht _0  (1.651 m)   Wt 156 lb 8 oz (71 kg)   SpO2 96%   BMI 26.04 kg/m  Wt Readings from Last 3 Encounters:  10/22/22 156 lb 8 oz (71 kg)  10/15/22 156 lb 6.4 oz (70.9 kg)  01/25/22 163 lb (73.9 kg)     Health Maintenance Due  Topic Date Due   HIV Screening  Never done   TETANUS/TDAP  Never done   Zoster Vaccines- Shingrix (1 of 2) Never  done   MAMMOGRAM  08/11/2020   COVID-19 Vaccine (4 - Pfizer series) 11/28/2020    There are no preventive care reminders to display for this patient.  Lab Results  Component Value Date   TSH 2.04 03/14/2022   Lab Results  Component Value Date   WBC 6.9 03/14/2022   HGB 13.0 03/14/2022   HCT 39.0 03/14/2022   MCV 87.2 03/14/2022   PLT 312 03/14/2022   Lab Results  Component Value Date   NA 139 03/14/2022   K 4.0 03/14/2022   CO2 25 03/14/2022   GLUCOSE 81 03/14/2022   BUN 12 03/14/2022   CREATININE 0.69 03/14/2022   BILITOT 0.4 03/14/2022   ALKPHOS 89 09/30/2020   AST 18 03/14/2022   ALT 14 03/14/2022   PROT 6.9 03/14/2022   ALBUMIN 4.6 09/30/2020   CALCIUM 9.2 03/14/2022   EGFR 102 03/14/2022   Lab Results  Component Value Date   CHOL 196 03/14/2022   Lab Results  Component Value Date   HDL 65 03/14/2022   Lab Results  Component Value Date   LDLCALC 113 (H) 03/14/2022   Lab Results  Component Value Date   TRIG 81 03/14/2022   Lab Results  Component Value Date   CHOLHDL 3.0 03/14/2022   Lab Results  Component Value Date   HGBA1C 5.7 (H) 09/30/2020      Assessment & Plan:   Problem List Items Addressed This Visit       Respiratory   Asthma    Started on prednisone      Relevant Medications   methylPREDNISolone (MEDROL DOSEPAK) 4 MG TBPK tablet   montelukast (SINGULAIR) 10 MG tablet   Vasomotor rhinitis - Primary    Claritin 10 mg p.o. daily        Digestive   GERD (gastroesophageal reflux disease)    Chronic problem      Relevant Medications   magic mouthwash (nystatin, lidocaine, diphenhydrAMINE, alum & mag hydroxide) suspension   Other Visit Diagnoses     Oral pharyngeal candidiasis       Relevant Medications   magic mouthwash (nystatin, lidocaine, diphenhydrAMINE, alum & mag hydroxide) suspension   fluconazole (DIFLUCAN) 150 MG tablet   Seasonal allergic rhinitis due to pollen       Relevant Medications   montelukast  (SINGULAIR) 10 MG tablet       Meds ordered this encounter  Medications   methylPREDNISolone (MEDROL DOSEPAK) 4 MG TBPK tablet    Sig: As directed  Dispense:  21 each    Refill:  0   magic mouthwash (nystatin, lidocaine, diphenhydrAMINE, alum & mag hydroxide) suspension    Sig: Swish and swallow 5 mLs 3 (three) times daily.    Dispense:  180 mL    Refill:  0   fluconazole (DIFLUCAN) 150 MG tablet    Sig: Take one tab a day for 3 days    Dispense:  3 tablet    Refill:  0   montelukast (SINGULAIR) 10 MG tablet    Sig: Take 1 tablet (10 mg total) by mouth at bedtime.    Dispense:  30 tablet    Refill:  6    Follow-up: No follow-ups on file.    Cletis Athens, MD

## 2022-12-25 ENCOUNTER — Other Ambulatory Visit: Payer: Self-pay | Admitting: Physician Assistant

## 2022-12-25 DIAGNOSIS — J301 Allergic rhinitis due to pollen: Secondary | ICD-10-CM

## 2022-12-27 ENCOUNTER — Other Ambulatory Visit: Payer: Self-pay | Admitting: Internal Medicine

## 2022-12-27 DIAGNOSIS — J301 Allergic rhinitis due to pollen: Secondary | ICD-10-CM

## 2023-01-10 ENCOUNTER — Encounter: Payer: 59 | Admitting: Physician Assistant

## 2023-03-12 ENCOUNTER — Ambulatory Visit
Admission: RE | Admit: 2023-03-12 | Discharge: 2023-03-12 | Disposition: A | Discharge status: Home / Self Care | Payer: 59 | Source: Ambulatory Visit | Attending: Internal Medicine | Admitting: Internal Medicine

## 2023-03-12 ENCOUNTER — Other Ambulatory Visit: Payer: Self-pay | Admitting: Internal Medicine

## 2023-03-12 ENCOUNTER — Ambulatory Visit
Admission: RE | Admit: 2023-03-12 | Discharge: 2023-03-12 | Disposition: A | Discharge status: Home / Self Care | Payer: 59 | Source: Ambulatory Visit | Attending: Cardiology | Admitting: Cardiology

## 2023-03-12 DIAGNOSIS — R0989 Other specified symptoms and signs involving the circulatory and respiratory systems: Secondary | ICD-10-CM | POA: Diagnosis not present

## 2023-03-12 DIAGNOSIS — R059 Cough, unspecified: Secondary | ICD-10-CM

## 2023-05-15 ENCOUNTER — Other Ambulatory Visit: Payer: Self-pay | Admitting: Internal Medicine

## 2023-05-15 DIAGNOSIS — R509 Fever, unspecified: Secondary | ICD-10-CM | POA: Diagnosis not present

## 2023-05-15 DIAGNOSIS — R6883 Chills (without fever): Secondary | ICD-10-CM | POA: Diagnosis not present

## 2023-05-18 LAB — CBC WITH DIFFERENTIAL/PLATELET
Basophils Absolute: 0 10*3/uL (ref 0.0–0.2)
Basos: 0 %
EOS (ABSOLUTE): 0 10*3/uL (ref 0.0–0.4)
Eos: 0 %
Hematocrit: 35.5 % (ref 34.0–46.6)
Hemoglobin: 12.2 g/dL (ref 11.1–15.9)
Immature Grans (Abs): 0 10*3/uL (ref 0.0–0.1)
Immature Granulocytes: 0 %
Lymphocytes Absolute: 1 10*3/uL (ref 0.7–3.1)
Lymphs: 10 %
MCH: 29 pg (ref 26.6–33.0)
MCHC: 34.4 g/dL (ref 31.5–35.7)
MCV: 85 fL (ref 79–97)
Monocytes Absolute: 0.8 10*3/uL (ref 0.1–0.9)
Monocytes: 8 %
Neutrophils Absolute: 8.3 10*3/uL — ABNORMAL HIGH (ref 1.4–7.0)
Neutrophils: 82 %
Platelets: 281 10*3/uL (ref 150–450)
RBC: 4.2 x10E6/uL (ref 3.77–5.28)
RDW: 12.7 % (ref 11.7–15.4)
WBC: 10.3 10*3/uL (ref 3.4–10.8)

## 2023-05-18 LAB — COMPREHENSIVE METABOLIC PANEL
ALT: 22 IU/L (ref 0–32)
AST: 27 IU/L (ref 0–40)
Albumin/Globulin Ratio: 1.5 (ref 1.2–2.2)
Albumin: 4.2 g/dL (ref 3.8–4.9)
Alkaline Phosphatase: 73 IU/L (ref 44–121)
BUN/Creatinine Ratio: 13 (ref 9–23)
BUN: 10 mg/dL (ref 6–24)
Bilirubin Total: 0.3 mg/dL (ref 0.0–1.2)
CO2: 21 mmol/L (ref 20–29)
Calcium: 9.1 mg/dL (ref 8.7–10.2)
Chloride: 93 mmol/L — ABNORMAL LOW (ref 96–106)
Creatinine, Ser: 0.75 mg/dL (ref 0.57–1.00)
Globulin, Total: 2.8 g/dL (ref 1.5–4.5)
Glucose: 109 mg/dL — ABNORMAL HIGH (ref 70–99)
Potassium: 4.2 mmol/L (ref 3.5–5.2)
Sodium: 129 mmol/L — ABNORMAL LOW (ref 134–144)
Total Protein: 7 g/dL (ref 6.0–8.5)
eGFR: 93 mL/min/{1.73_m2} (ref >59)

## 2023-05-18 LAB — URINALYSIS, COMPLETE
Bilirubin, UA: NEGATIVE
Glucose, UA: NEGATIVE
Ketones, UA: NEGATIVE
Leukocytes,UA: NEGATIVE
Nitrite, UA: NEGATIVE
Protein,UA: NEGATIVE
RBC, UA: NEGATIVE
Specific Gravity, UA: 1.007 (ref 1.005–1.030)
Urobilinogen, Ur: 0.2 mg/dL (ref 0.2–1.0)
pH, UA: 6.5 (ref 5.0–7.5)

## 2023-05-18 LAB — MICROSCOPIC EXAMINATION
Bacteria, UA: NONE SEEN
Casts: NONE SEEN /lpf
Epithelial Cells (non renal): NONE SEEN /hpf (ref 0–10)
RBC, Urine: NONE SEEN /hpf (ref 0–2)
WBC, UA: NONE SEEN /hpf (ref 0–5)

## 2023-05-18 LAB — URINE CULTURE

## 2023-05-22 ENCOUNTER — Other Ambulatory Visit: Payer: Self-pay | Admitting: Internal Medicine

## 2023-05-22 DIAGNOSIS — R1030 Lower abdominal pain, unspecified: Secondary | ICD-10-CM

## 2023-05-22 DIAGNOSIS — R319 Hematuria, unspecified: Secondary | ICD-10-CM

## 2023-05-22 DIAGNOSIS — M549 Dorsalgia, unspecified: Secondary | ICD-10-CM

## 2023-05-23 DIAGNOSIS — R0602 Shortness of breath: Secondary | ICD-10-CM | POA: Diagnosis not present

## 2023-05-23 DIAGNOSIS — I2699 Other pulmonary embolism without acute cor pulmonale: Secondary | ICD-10-CM | POA: Diagnosis not present

## 2023-05-23 DIAGNOSIS — R059 Cough, unspecified: Secondary | ICD-10-CM | POA: Diagnosis not present

## 2023-05-28 ENCOUNTER — Ambulatory Visit
Admission: RE | Admit: 2023-05-28 | Discharge: 2023-05-28 | Disposition: A | Discharge status: Home / Self Care | Payer: 59 | Source: Ambulatory Visit | Attending: Internal Medicine | Admitting: Internal Medicine

## 2023-05-28 DIAGNOSIS — R319 Hematuria, unspecified: Secondary | ICD-10-CM | POA: Diagnosis not present

## 2023-05-28 DIAGNOSIS — R1030 Lower abdominal pain, unspecified: Secondary | ICD-10-CM

## 2023-05-28 DIAGNOSIS — M549 Dorsalgia, unspecified: Secondary | ICD-10-CM

## 2023-06-04 DIAGNOSIS — G4733 Obstructive sleep apnea (adult) (pediatric): Secondary | ICD-10-CM | POA: Diagnosis not present

## 2023-06-24 DIAGNOSIS — G4733 Obstructive sleep apnea (adult) (pediatric): Secondary | ICD-10-CM | POA: Diagnosis not present

## 2023-07-04 DIAGNOSIS — G4733 Obstructive sleep apnea (adult) (pediatric): Secondary | ICD-10-CM | POA: Diagnosis not present

## 2023-07-16 DIAGNOSIS — G4733 Obstructive sleep apnea (adult) (pediatric): Secondary | ICD-10-CM | POA: Diagnosis not present

## 2023-08-04 DIAGNOSIS — G4733 Obstructive sleep apnea (adult) (pediatric): Secondary | ICD-10-CM | POA: Diagnosis not present

## 2023-08-23 DIAGNOSIS — G4733 Obstructive sleep apnea (adult) (pediatric): Secondary | ICD-10-CM | POA: Diagnosis not present

## 2023-09-04 DIAGNOSIS — G4733 Obstructive sleep apnea (adult) (pediatric): Secondary | ICD-10-CM | POA: Diagnosis not present

## 2023-10-04 DIAGNOSIS — G4733 Obstructive sleep apnea (adult) (pediatric): Secondary | ICD-10-CM | POA: Diagnosis not present

## 2023-10-23 DIAGNOSIS — G4733 Obstructive sleep apnea (adult) (pediatric): Secondary | ICD-10-CM | POA: Diagnosis not present

## 2023-11-04 DIAGNOSIS — G4733 Obstructive sleep apnea (adult) (pediatric): Secondary | ICD-10-CM | POA: Diagnosis not present

## 2023-11-08 ENCOUNTER — Other Ambulatory Visit: Payer: Self-pay | Admitting: Internal Medicine

## 2023-11-08 DIAGNOSIS — Z1231 Encounter for screening mammogram for malignant neoplasm of breast: Secondary | ICD-10-CM

## 2023-12-02 DIAGNOSIS — G4733 Obstructive sleep apnea (adult) (pediatric): Secondary | ICD-10-CM | POA: Diagnosis not present

## 2023-12-04 DIAGNOSIS — G4733 Obstructive sleep apnea (adult) (pediatric): Secondary | ICD-10-CM | POA: Diagnosis not present

## 2023-12-16 DIAGNOSIS — B301 Conjunctivitis due to adenovirus: Secondary | ICD-10-CM | POA: Diagnosis not present

## 2024-07-02 ENCOUNTER — Encounter: Payer: Self-pay | Admitting: Physician Assistant

## 2024-07-02 ENCOUNTER — Ambulatory Visit: Payer: Self-pay | Admitting: Physician Assistant

## 2024-07-02 VITALS — BP 130/70 | HR 68 | Temp 97.8°F | Resp 16 | Ht 65.0 in | Wt 162.0 lb

## 2024-07-02 DIAGNOSIS — Z1231 Encounter for screening mammogram for malignant neoplasm of breast: Secondary | ICD-10-CM

## 2024-07-02 DIAGNOSIS — E782 Mixed hyperlipidemia: Secondary | ICD-10-CM | POA: Diagnosis not present

## 2024-07-02 DIAGNOSIS — Z1329 Encounter for screening for other suspected endocrine disorder: Secondary | ICD-10-CM

## 2024-07-02 DIAGNOSIS — J45909 Unspecified asthma, uncomplicated: Secondary | ICD-10-CM | POA: Diagnosis not present

## 2024-07-02 DIAGNOSIS — R7303 Prediabetes: Secondary | ICD-10-CM

## 2024-07-02 DIAGNOSIS — E538 Deficiency of other specified B group vitamins: Secondary | ICD-10-CM

## 2024-07-02 DIAGNOSIS — R5383 Other fatigue: Secondary | ICD-10-CM

## 2024-07-02 DIAGNOSIS — M26623 Arthralgia of bilateral temporomandibular joint: Secondary | ICD-10-CM | POA: Diagnosis not present

## 2024-07-02 DIAGNOSIS — E559 Vitamin D deficiency, unspecified: Secondary | ICD-10-CM

## 2024-07-02 NOTE — Progress Notes (Signed)
 [facility] [address] [place], [place] [address]  Internal MEDICINE  Office Visit Note  Patient Name: Yvonne Jones  [id number]  [id number]  Date of Service: 07/02/2024  Chief Complaint  Patient presents with   Follow-up   Asthma   Gastroesophageal Reflux   Quality Metric Gaps    physical    HPI Pt is here to re-establish care -had been seeing Dr. Britta, but he has retired -due for CPE with labs and will order -will plan for pap with CPE, but also considering est with GYN in future -needs mammogram -tries to walk and get some exercise -No hx of HTN, but has had some borderline BP in past, never on medication -sees pulmonology for asthma -Right ear pain at times, feels it in front of/below ear. Some ringing at times. On exam, mainly jaw joint clicking and consistent with TMJ. Admits she has been having this off and on for years and that recently has been having work on teeth on left side and has been chewing on right side alone which may have exacerbated this. Discussed avoiding crunchy/chewy foods and considering night guard in case of clenching/grinding as well and may discuss with dentist  Current Medication: Outpatient Encounter Medications as of 07/02/2024  Medication Sig   ALBUTEROL SULFATE HFA IN Inhale into the lungs.   budesonide -glycopyrrolate-formoterol  (BREZTRI AEROSPHERE) 160-9-4.8 MCG/ACT AERO inhaler Inhale 2 puffs into the lungs 2 (two) times daily.   Calcium Carbonate-Vitamin D (CALTRATE 600+D PO) Take by mouth.   celecoxib (CELEBREX) 200 MG capsule Take 200 mg by mouth 2 (two) times daily.   fluticasone  (FLONASE ) 50 MCG/ACT nasal spray instill 1 spray into each nostril twice a day   montelukast  (SINGULAIR ) 10 MG tablet Take 1 tablet (10 mg total) by mouth at bedtime.   Multiple Vitamin (MULTIVITAMIN) tablet Take 1 tablet by mouth daily.   nystatin  (MYCOSTATIN ) 100000 UNIT/ML suspension Swish and swallow 5 cc after meals   Omega-3 Fatty Acids  (FISH OIL CONCENTRATE PO) Take by mouth.   omeprazole  (PRILOSEC) 40 MG capsule Take 1 capsule (40 mg total) by mouth daily.   [DISCONTINUED] budesonide -formoterol  (SYMBICORT ) 160-4.5 MCG/ACT inhaler INHALE TWO PUFFS INTO THE LUNGS TWICE DAILY   [DISCONTINUED] fluconazole  (DIFLUCAN ) 150 MG tablet Take one tab a day for 3 days   [DISCONTINUED] gabapentin  (NEURONTIN ) 100 MG capsule Take 1 capsule (100 mg total) by mouth 3 (three) times daily.   [DISCONTINUED] magic mouthwash (nystatin , lidocaine , diphenhydrAMINE, alum & mag hydroxide) suspension Swish and swallow 5 mLs 3 (three) times daily.   [DISCONTINUED] methylPREDNISolone  (MEDROL  DOSEPAK) 4 MG TBPK tablet As directed   No facility-administered encounter medications on file as of 07/02/2024.    Surgical History: Past Surgical History:  Procedure Laterality Date   COLONOSCOPY WITH PROPOFOL  N/A 10/02/2018   Procedure: COLONOSCOPY WITH PROPOFOL ;  Surgeon: Unk Corinn Skiff, MD;  Location: [facility] ENDOSCOPY;  Service: Gastroenterology;  Laterality: N/A;   LAPAROSCOPY      Medical History: Past Medical History:  Diagnosis Date   Asthma    Environmental allergies    GERD (gastroesophageal reflux disease)     Family History: Family History  Problem Relation Age of Onset   Heart disease Mother    Diabetes Father    Hypertension Father    Breast cancer Other     Social History   Socioeconomic History   Marital status: Married    Spouse name: Not on file   Number of children: Not on file  Years of education: Not on file   Highest education level: Not on file  Occupational History   Not on file  Tobacco Use   Smoking status: Never   Smokeless tobacco: Never  Vaping Use   Vaping status: Never Used  Substance and Sexual Activity   Alcohol use: Never   Drug use: Never   Sexual activity: Yes    Birth control/protection: Post-menopausal  Other Topics Concern   Not on file  Social History Narrative   Not on file   Social  Drivers of Health   Financial Resource Strain: Not on file  Food Insecurity: Not on file  Transportation Needs: Not on file  Physical Activity: Not on file  Stress: Not on file  Social Connections: Not on file  Intimate Partner Violence: Not on file      Review of Systems  Constitutional:  Negative for chills, fatigue and unexpected weight change.  HENT:  Positive for ear pain. Negative for congestion, rhinorrhea, sneezing and sore throat.   Eyes:  Negative for redness.  Respiratory:  Negative for cough, chest tightness and shortness of breath.   Cardiovascular:  Negative for chest pain and palpitations.  Gastrointestinal:  Negative for abdominal pain, constipation, diarrhea, nausea and vomiting.  Genitourinary:  Negative for dysuria and frequency.  Musculoskeletal:  Negative for arthralgias, back pain, joint swelling and neck pain.  Skin:  Negative for rash.  Neurological: Negative.  Negative for tremors and numbness.  Hematological:  Negative for adenopathy. Does not bruise/bleed easily.  Psychiatric/Behavioral:  Negative for behavioral problems (Depression), sleep disturbance and suicidal ideas. The patient is not nervous/anxious.     Vital Signs: BP 130/70   Pulse 68   Temp 97.8 F (36.6 C)   Resp 16   Ht 5' 5 (1.651 m)   Wt 162 lb (73.5 kg)   SpO2 98%   BMI 26.96 kg/m    Physical Exam Vitals and nursing note reviewed.  Constitutional:      General: She is not in acute distress.    Appearance: She is well-developed. She is not diaphoretic.  HENT:     Head: Normocephalic and atraumatic.     Right Ear: Tympanic membrane normal.     Left Ear: Tympanic membrane normal.     Mouth/Throat:     Comments: Clicking of jaw on opening and side to side movement bilaterally Neck:     Thyroid: No thyromegaly.     Vascular: No JVD.     Trachea: No tracheal deviation.  Cardiovascular:     Rate and Rhythm: Normal rate and regular rhythm.     Heart sounds: Normal heart  sounds. No murmur heard.    No friction rub. No gallop.  Pulmonary:     Effort: Pulmonary effort is normal. No respiratory distress.     Breath sounds: No wheezing or rales.  Chest:     Chest wall: No tenderness.  Musculoskeletal:        General: Normal range of motion.  Skin:    General: Skin is warm and dry.  Neurological:     Mental Status: She is alert and oriented to person, place, and time.     Cranial Nerves: No cranial nerve deficit.  Psychiatric:        Behavior: Behavior normal.        Thought Content: Thought content normal.        Judgment: Judgment normal.        Assessment/Plan: 1. Bilateral temporomandibular joint pain (  Primary) Advised to avoid crunchy/chewy foods and consider nightguard to help with any clenching grinding that may contribute to TMJ pain.  Patient is also going to discuss with dentist at next visit  2. Uncomplicated asthma, unspecified asthma severity, unspecified whether persistent Follow-up with pulmonology  3. Visit for screening mammogram - MM 3D SCREENING MAMMOGRAM BILATERAL BREAST; Future  4. Mixed hyperlipidemia - Lipid Panel With LDL/HDL Ratio  5. Prediabetes Borderline A1c per chart review and does have Fhx of DM, will screen - Hgb A1C w/o eAG  6. Thyroid disorder screen - TSH + free T4  7. B12 deficiency - B12 and Folate Panel  8. Vitamin D deficiency - VITAMIN D 25 Hydroxy (Vit-D Deficiency, Fractures)  9. Other fatigue - CBC w/Diff/Platelet - Comprehensive metabolic panel with GFR - TSH + free T4 - Lipid Panel With LDL/HDL Ratio - Hgb A1C w/o eAG - VITAMIN D 25 Hydroxy (Vit-D Deficiency, Fractures) - B12 and Folate Panel   General Counseling: Cherryl verbalizes understanding of the findings of todays visit and agrees with plan of treatment. I have discussed any further diagnostic evaluation that may be needed or ordered today. We also reviewed her medications today. she has been encouraged to call the office with  any questions or concerns that should arise related to todays visit.    Orders Placed This Encounter  Procedures   MM 3D SCREENING MAMMOGRAM BILATERAL BREAST   CBC w/Diff/Platelet   Comprehensive metabolic panel with GFR   TSH + free T4   Lipid Panel With LDL/HDL Ratio   Hgb A1C w/o eAG   VITAMIN D 25 Hydroxy (Vit-D Deficiency, Fractures)   B12 and Folate Panel    No orders of the defined types were placed in this encounter.   This patient was seen by Tinnie Pro, PA-C in collaboration with Dr. Sigrid Bathe as a part of collaborative care agreement.   Total time spent:30 Minutes Time spent includes review of chart, medications, test results, and follow up plan with the patient.      Dr Fozia M Khan Internal medicine

## 2024-07-16 ENCOUNTER — Ambulatory Visit: Payer: Self-pay | Admitting: Physician Assistant

## 2024-07-16 LAB — B12 AND FOLATE PANEL
Folate: >20 ng/mL (ref >3.0)
Vitamin B-12: 605 pg/mL (ref 232–1245)

## 2024-07-16 LAB — CBC WITH DIFFERENTIAL/PLATELET
Basophils Absolute: 0 x10E3/uL (ref 0.0–0.2)
Basos: 0 %
EOS (ABSOLUTE): 0 x10E3/uL (ref 0.0–0.4)
Eos: 0 %
Hematocrit: 38.3 % (ref 34.0–46.6)
Hemoglobin: 12.4 g/dL (ref 11.1–15.9)
Immature Grans (Abs): 0 x10E3/uL (ref 0.0–0.1)
Immature Granulocytes: 0 %
Lymphocytes Absolute: 1.7 x10E3/uL (ref 0.7–3.1)
Lymphs: 26 %
MCH: 28.9 pg (ref 26.6–33.0)
MCHC: 32.4 g/dL (ref 31.5–35.7)
MCV: 89 fL (ref 79–97)
Monocytes Absolute: 0.6 x10E3/uL (ref 0.1–0.9)
Monocytes: 9 %
Neutrophils Absolute: 4.1 x10E3/uL (ref 1.4–7.0)
Neutrophils: 65 %
Platelets: 319 x10E3/uL (ref 150–450)
RBC: 4.29 x10E6/uL (ref 3.77–5.28)
RDW: 12.9 % (ref 11.7–15.4)
WBC: 6.4 x10E3/uL (ref 3.4–10.8)

## 2024-07-16 LAB — COMPREHENSIVE METABOLIC PANEL WITH GFR
ALT: 22 IU/L (ref 0–32)
AST: 26 IU/L (ref 0–40)
Albumin: 4.1 g/dL (ref 3.8–4.9)
Alkaline Phosphatase: 85 IU/L (ref 44–121)
BUN/Creatinine Ratio: 16 (ref 9–23)
BUN: 11 mg/dL (ref 6–24)
Bilirubin Total: 0.3 mg/dL (ref 0.0–1.2)
CO2: 22 mmol/L (ref 20–29)
Calcium: 9.2 mg/dL (ref 8.7–10.2)
Chloride: 100 mmol/L (ref 96–106)
Creatinine, Ser: 0.7 mg/dL (ref 0.57–1.00)
Globulin, Total: 3.2 g/dL (ref 1.5–4.5)
Glucose: 81 mg/dL (ref 70–99)
Potassium: 4.3 mmol/L (ref 3.5–5.2)
Sodium: 137 mmol/L (ref 134–144)
Total Protein: 7.3 g/dL (ref 6.0–8.5)
eGFR: 100 mL/min/1.73 (ref >59)

## 2024-07-16 LAB — LIPID PANEL WITH LDL/HDL RATIO
Cholesterol, Total: 212 mg/dL — ABNORMAL HIGH (ref 100–199)
HDL: 67 mg/dL (ref >39)
LDL Chol Calc (NIH): 138 mg/dL — ABNORMAL HIGH (ref 0–99)
LDL/HDL Ratio: 2.1 ratio (ref 0.0–3.2)
Triglycerides: 42 mg/dL (ref 0–149)
VLDL Cholesterol Cal: 7 mg/dL (ref 5–40)

## 2024-07-16 LAB — HGB A1C W/O EAG: Hgb A1c MFr Bld: 5.5 % (ref 4.8–5.6)

## 2024-07-16 LAB — TSH+FREE T4
Free T4: 1.3 ng/dL (ref 0.82–1.77)
TSH: 2.52 u[IU]/mL (ref 0.450–4.500)

## 2024-07-16 LAB — VITAMIN D 25 HYDROXY (VIT D DEFICIENCY, FRACTURES): Vit D, 25-Hydroxy: 42.7 ng/mL (ref 30.0–100.0)

## 2024-08-13 ENCOUNTER — Telehealth: Payer: Self-pay | Admitting: Physician Assistant

## 2024-08-13 NOTE — Telephone Encounter (Signed)
 Left vm and sent mychart message to confirm 08/20/24 appointment-Toni

## 2024-08-20 ENCOUNTER — Other Ambulatory Visit: Admitting: Physician Assistant

## 2024-08-26 ENCOUNTER — Other Ambulatory Visit (HOSPITAL_BASED_OUTPATIENT_CLINIC_OR_DEPARTMENT_OTHER): Payer: Self-pay | Admitting: Internal Medicine

## 2024-08-26 DIAGNOSIS — Z8249 Family history of ischemic heart disease and other diseases of the circulatory system: Secondary | ICD-10-CM

## 2024-09-08 ENCOUNTER — Ambulatory Visit
Admission: RE | Admit: 2024-09-08 | Discharge: 2024-09-08 | Disposition: A | Discharge status: Home / Self Care | Payer: Self-pay | Source: Ambulatory Visit | Attending: Internal Medicine | Admitting: Internal Medicine

## 2024-09-08 DIAGNOSIS — Z8249 Family history of ischemic heart disease and other diseases of the circulatory system: Secondary | ICD-10-CM | POA: Insufficient documentation
